# Patient Record
Sex: Male | Born: 1980 | Race: White | Hispanic: No | Marital: Married | Smoking: Current every day smoker
Health system: Southern US, Community
[De-identification: ages and names within clinical notes are randomized; demographics above are authoritative.]

## PROBLEM LIST (undated history)

## (undated) DIAGNOSIS — F32A Depression, unspecified: Secondary | ICD-10-CM

## (undated) DIAGNOSIS — T7840XA Allergy, unspecified, initial encounter: Secondary | ICD-10-CM

## (undated) DIAGNOSIS — F1910 Other psychoactive substance abuse, uncomplicated: Secondary | ICD-10-CM

## (undated) DIAGNOSIS — F329 Major depressive disorder, single episode, unspecified: Secondary | ICD-10-CM

## (undated) DIAGNOSIS — F419 Anxiety disorder, unspecified: Secondary | ICD-10-CM

## (undated) DIAGNOSIS — R569 Unspecified convulsions: Secondary | ICD-10-CM

## (undated) DIAGNOSIS — G4700 Insomnia, unspecified: Secondary | ICD-10-CM

## (undated) HISTORY — DX: Major depressive disorder, single episode, unspecified: F32.9

## (undated) HISTORY — DX: Unspecified convulsions: R56.9

## (undated) HISTORY — DX: Depression, unspecified: F32.A

## (undated) HISTORY — DX: Other psychoactive substance abuse, uncomplicated: F19.10

## (undated) HISTORY — DX: Insomnia, unspecified: G47.00

## (undated) HISTORY — DX: Anxiety disorder, unspecified: F41.9

## (undated) HISTORY — DX: Allergy, unspecified, initial encounter: T78.40XA

---

## 2003-07-03 HISTORY — PX: WISDOM TOOTH EXTRACTION: SHX21

## 2016-08-14 ENCOUNTER — Other Ambulatory Visit: Payer: Self-pay | Admitting: *Deleted

## 2016-08-14 ENCOUNTER — Ambulatory Visit
Admission: RE | Admit: 2016-08-14 | Discharge: 2016-08-14 | Disposition: A | Discharge status: Home / Self Care | Payer: PRIVATE HEALTH INSURANCE | Source: Ambulatory Visit | Attending: *Deleted | Admitting: *Deleted

## 2016-08-14 DIAGNOSIS — T148XXA Other injury of unspecified body region, initial encounter: Secondary | ICD-10-CM

## 2016-10-11 ENCOUNTER — Ambulatory Visit (INDEPENDENT_AMBULATORY_CARE_PROVIDER_SITE_OTHER): Payer: 59 | Admitting: Physician Assistant

## 2016-10-11 ENCOUNTER — Encounter: Payer: Self-pay | Admitting: Physician Assistant

## 2016-10-11 VITALS — BP 120/84 | HR 80 | Temp 98.1°F | Ht 75.0 in | Wt 197.4 lb

## 2016-10-11 DIAGNOSIS — Z720 Tobacco use: Secondary | ICD-10-CM | POA: Insufficient documentation

## 2016-10-11 DIAGNOSIS — F32A Depression, unspecified: Secondary | ICD-10-CM

## 2016-10-11 DIAGNOSIS — F419 Anxiety disorder, unspecified: Secondary | ICD-10-CM | POA: Diagnosis not present

## 2016-10-11 DIAGNOSIS — F329 Major depressive disorder, single episode, unspecified: Secondary | ICD-10-CM | POA: Diagnosis not present

## 2016-10-11 DIAGNOSIS — G4700 Insomnia, unspecified: Secondary | ICD-10-CM

## 2016-10-11 DIAGNOSIS — F1911 Other psychoactive substance abuse, in remission: Secondary | ICD-10-CM | POA: Insufficient documentation

## 2016-10-11 MED ORDER — VENLAFAXINE HCL ER 150 MG PO CP24: 150.0000 mg | ORAL_CAPSULE | Freq: Every day | ORAL | 2 refills | Status: DC

## 2016-10-11 NOTE — Progress Notes (Signed)
Reginald Bentley is a 36 y.o. male here to Establish Care and Follow up from Treatment Center.   I acted as a Neurosurgeon for [employer], PA-C Corky Mull, LPN  History of Present Illness:   Chief Complaint  Patient presents with  . Establish Care  . Follow up from Treatment [facility] for [facility] and [facility]   Acute Concerns: none  Chronic Issues: Anxiety and depression -- Effexor 75 mg BID, denies any suicidal or thoughts, feels well controlled on this medication since being in rehab Alcohol abuse -- started drinking around age 58, "blackout drinker" since age 16, was recently in a treatment center for alcohol and drugs from 08/06/16 to 09/03/16, significant family history of suicide and drug/alcohol abuse, seeing therapist weekly, wife has also stopped drinking in support, he feels like he has turned a new leaf and is doing better and has a good support system Tobacco abuse -- smokes about 1/2 PPD  Health Maintenance: Immunizations -- needs Tdap Colonoscopy -- no family hx, will schedule at age 29 Diet -- smoothies in the morning, tries to eat all food groups, occasionally has fast food when diet allows Caffeine intake -- coffee in morning, no energy drinks Sleep habits -- takes Trazodone, does have a little bit of brain fog in AM, 7-8 hours per night Exercise -- limited secondary to time Weight -- Weight: 197 lb 6.1 oz (89.5 kg) 197 lb -- normally around 195 lb Mood -- calm and able to "respond instead of react" while on Effexor  Depression screen [facility] 2/9 10/11/2016  Decreased Interest 0  Down, Depressed, Hopeless 0  PHQ - 2 Score 0  Altered sleeping 0  Tired, decreased energy 1  Change in appetite 0  Feeling bad or failure about yourself  1  Trouble concentrating 2  Moving slowly or fidgety/restless 0  Suicidal thoughts 0  PHQ-9 Score 4    Other providers/specialists: Therapist - Victorino Dike at [facility]  PMHx, SurgHx, SocialHx, Medications, and Allergies were  reviewed in the Visit Navigator and updated as appropriate.  Current Medications:   Current Outpatient Prescriptions:  .  methocarbamol (ROBAXIN) 750 MG tablet, Take 750 mg by mouth as needed. , Disp: , Rfl:  .  Travoprost, BAK Free, (TRAVATAN Z) 0.004 % SOLN ophthalmic solution, INT 1 GTT IN OU QHS, Disp: , Rfl:  .  trazodone (DESYREL) 300 MG tablet, Take 50-75 mg by mouth at bedtime. , Disp: , Rfl:  .  venlafaxine XR (EFFEXOR XR) 150 MG 24 hr capsule, Take 1 capsule (150 mg total) by mouth daily with breakfast., Disp: 30 capsule, Rfl: 2   Review of Systems:   Review of Systems  Constitutional: Positive for malaise/fatigue.  HENT: Negative.   Eyes: Negative.   Respiratory: Negative.   Cardiovascular: Negative.   Gastrointestinal: Negative.   Genitourinary: Negative.   Musculoskeletal:       Stiffness back, shoulder and neck area  Skin: Negative.   Neurological: Negative.   Endo/Heme/Allergies: Positive for environmental allergies.  Psychiatric/Behavioral: The patient is nervous/anxious and has insomnia.     Vitals:   Vitals:   10/11/16 0900  BP: 120/84  Pulse: 80  Temp: 98.1 F (36.7 C)  TempSrc: Oral  SpO2: 95%  Weight: 197 lb 6.1 oz (89.5 kg)  Height:  (1.905 m)     Body mass index is 24.67 kg/m.  Physical Exam:   Physical Exam  Constitutional: He appears well-developed. He is cooperative.  Non-toxic appearance. He does not  have a sickly appearance. He does not appear ill. No distress.  Cardiovascular: Normal rate, regular rhythm, S1 normal, S2 normal, normal heart sounds and normal pulses.   No LE edema  Pulmonary/Chest: Effort normal and breath sounds normal.  Neurological: He is alert.  Psychiatric: He has a normal mood and affect.  Nursing note and vitals reviewed.     Assessment and Plan:    Problem List Items Addressed This Visit      Other   Anxiety - Primary    Well controlled. Refill Effexor XR 150 mg daily x 3 months. Follow-up then.       Relevant Medications   trazodone (DESYREL) 300 MG tablet   venlafaxine XR (EFFEXOR XR) 150 MG 24 hr capsule   Depression    PHQ-9 is 4 today. Continue Effexor. Follow-up in 3 months, sooner if needed.      Relevant Medications   trazodone (DESYREL) 300 MG tablet   venlafaxine XR (EFFEXOR XR) 150 MG 24 hr capsule       . Reviewed expectations re: course of current medical issues. . Discussed self-management of symptoms. . Outlined signs and symptoms indicating need for more acute intervention. . Patient verbalized understanding and all questions were answered. . See orders for this visit as documented in the electronic medical record. . Patient received an After-Visit Summary.  CMA or LPN served as scribe during this visit. History, Physical, and Plan performed by medical provider. Documentation and orders reviewed and attested to.  Jarold Motto, PA-C

## 2016-10-11 NOTE — Assessment & Plan Note (Signed)
PHQ-9 is 4 today. Continue Effexor. Follow-up in 3 months, sooner if needed.

## 2016-10-11 NOTE — Patient Instructions (Signed)
It was great meeting you today!  Let's follow up in 3 months for a physical and to talk about how the effexor is working for you. We are here for you sooner if needed!  Welcome to [facility]!

## 2016-10-11 NOTE — Assessment & Plan Note (Signed)
Well controlled. Refill Effexor XR 150 mg daily x 3 months. Follow-up then.

## 2016-10-11 NOTE — Progress Notes (Signed)
Pre visit review using our clinic review tool, if applicable. No additional management support is needed unless otherwise documented below in the visit note. 

## 2016-11-01 ENCOUNTER — Encounter: Payer: Self-pay | Admitting: Physician Assistant

## 2016-11-01 ENCOUNTER — Ambulatory Visit (INDEPENDENT_AMBULATORY_CARE_PROVIDER_SITE_OTHER): Payer: 59 | Admitting: Physician Assistant

## 2016-11-01 VITALS — BP 130/84 | HR 90 | Temp 98.0°F | Ht 75.0 in | Wt 191.2 lb

## 2016-11-01 DIAGNOSIS — W57XXXA Bitten or stung by nonvenomous insect and other nonvenomous arthropods, initial encounter: Secondary | ICD-10-CM

## 2016-11-01 DIAGNOSIS — L03114 Cellulitis of left upper limb: Secondary | ICD-10-CM | POA: Diagnosis not present

## 2016-11-01 MED ORDER — DOXYCYCLINE HYCLATE 100 MG PO TABS: 100.0000 mg | ORAL_TABLET | Freq: Two times a day (BID) | ORAL | 0 refills | Status: DC

## 2016-11-01 NOTE — Progress Notes (Signed)
Reginald Bentley R Bentley is a 36 y.o. male here for a Tick bite left inner forearm.  I acted as a Neurosurgeonscribe for [employer]antha Babe Anthis, PA-C Reginald Mullonna Orphanos, LPN  History of Present Illness:   Chief Complaint  Patient presents with  . Insect Bite    Tick bite, Monday left inner forearm  . red and swollen  . Pruritis    HPI  Patient reports that on Monday he removed a tick from his left inner forearm, suspects that it came off of his dog Sunday night while they were sleeping and attached in the middle of the night. He suspects that the tick was not attached to him for more than 6-8 hours. He denies fever, chills, nausea, headaches, vomiting, rash. He does note that the site from where the tick was is now slightly tender, swollen, and very itchy. He has been trying his best not to itch it. He has not taken anything for his symptoms. He denies any history of prior tickborne illness.   PMHx, SurgHx, SocialHx, Medications, and Allergies were reviewed in the Visit Navigator and updated as appropriate.  Current Medications:   Current Outpatient Prescriptions:  .  methocarbamol (ROBAXIN) 750 MG tablet, Take 750 mg by mouth as needed. , Disp: , Rfl:  .  Travoprost, BAK Free, (TRAVATAN Z) 0.004 % SOLN ophthalmic solution, INT 1 GTT IN OU QHS, Disp: , Rfl:  .  trazodone (DESYREL) 300 MG tablet, Take 50-75 mg by mouth at bedtime. , Disp: , Rfl:  .  venlafaxine XR (EFFEXOR XR) 150 MG 24 hr capsule, Take 1 capsule (150 mg total) by mouth daily with breakfast., Disp: 30 capsule, Rfl: 2 .  doxycycline (VIBRA-TABS) 100 MG tablet, Take 1 tablet (100 mg total) by mouth 2 (two) times daily., Disp: 20 tablet, Rfl: 0   Review of Systems:   Review of Systems  Constitutional: Negative for chills and fever.  HENT: Negative.   Eyes: Negative.   Respiratory: Negative.   Cardiovascular: Negative.   Gastrointestinal: Negative.   Genitourinary: Negative.   Musculoskeletal: Negative.   Skin: Positive for itching.       Left  inner forearm red raised area, swollen.  Neurological: Negative for sensory change, loss of consciousness and headaches.  Endo/Heme/Allergies: Negative.   Psychiatric/Behavioral: Negative.     Vitals:   Vitals:   11/01/16 0941  BP: 130/84  Pulse: 90  Temp: 98 F (36.7 C)  TempSrc: Oral  SpO2: 97%  Weight: 191 lb 4 oz (86.8 kg)  Height: 6\' 3"  (1.905 m)     Body mass index is 23.9 kg/m.  Physical Exam:   Physical Exam  Constitutional: He appears well-developed. He is cooperative.  Non-toxic appearance. He does not have a sickly appearance. He does not appear ill. No distress.  Cardiovascular: Normal rate, regular rhythm, S1 normal, S2 normal, normal heart sounds and normal pulses.   No LE edema  Pulmonary/Chest: Effort normal and breath sounds normal.  Neurological: He is alert.  Skin:  Nickel-sized area of erythema and slightly raised skin at anterior left forearm, no discharge or streaking evident  Nursing note and vitals reviewed.     Assessment and Plan:    Reginald Bentley was seen today for insect bite, red and swollen and pruritis.  Diagnoses and all orders for this visit:  Cellulitis of left upper extremity  Tick bite, initial encounter  Other orders -     doxycycline (VIBRA-TABS) 100 MG tablet; Take 1 tablet (100 mg total) by mouth  2 (two) times daily.  Patient with what appears to be a secondary skin infection of prior tick bite site. I have low suspicion for tickborne illness. Reassured patient. Also provided him with list of red flags to return to clinic in case he develops concerns for tickborne illness. Start doxycycline per orders. May take antihistamine to help with itching. I also advised to use ice packs to also help with itching. Avoid scratching. Patient agreeable to plan.  . Reviewed expectations re: course of current medical issues. . Discussed self-management of symptoms. . Outlined signs and symptoms indicating need for more acute  intervention. . Patient verbalized understanding and all questions were answered. . See orders for this visit as documented in the electronic medical record. . Patient received an After-Visit Summary.  CMA or LPN served as scribe during this visit. History, Physical, and Plan performed by medical provider. Documentation and orders reviewed and attested to.  Jarold Motto, PA-C

## 2016-11-01 NOTE — Progress Notes (Signed)
Pre visit review using our clinic review tool, if applicable. No additional management support is needed unless otherwise documented below in the visit note. 

## 2016-11-01 NOTE — Patient Instructions (Signed)
Start doxycycline today.  Tick Bite Information, Adult Ticks are insects that draw blood for food. Most ticks live in shrubs and grassy areas. They climb onto people and animals that brush against the leaves and grasses that they rest on. Then they bite, attaching themselves to the skin. Most ticks are harmless, but some ticks carry germs that can spread to a person through a bite and cause a disease. To reduce your risk of getting a disease from a tick bite, it is important to take steps to prevent tick bites. It is also important to check for ticks after being outdoors. If you find that a tick has attached to you, watch for symptoms of disease. How can I prevent tick bites? Take these steps to help prevent tick bites when you are outdoors in an area where ticks are found:  Use insect repellent that has DEET (20% or higher), picaridin, or IR3535 in it. Use it on:  Skin that is showing.  The top of your boots.  Your pant legs.  Your sleeve cuffs.  For repellent products that contain permethrin, follow product instructions. Use these products on:  Clothing.  Gear.  Boots.  Tents.  Wear protective clothing. Long sleeves and long pants offer the best protection from ticks.  Wear light-colored clothing so you can see ticks more easily.  Tuck your pant legs into your socks.  If you go walking on a trail, stay in the middle of the trail so your skin, hair, and clothing do not touch the bushes.  Avoid walking through areas with long grass.  Check for ticks on your clothing, hair, and skin often while you are outside, and check again before you go inside. Make sure to check the places that ticks attach themselves most often. These places include the scalp, neck, armpits, waist, groin, and joint areas. Ticks that carry a disease called Lyme disease have to be attached to the skin for 24-48 hours. Checking for ticks every day will lessen your risk of this and other diseases.  When you  come indoors, wash your clothes and take a shower or a bath right away. Dry your clothes in a dryer on high heat for at least 60 minutes. This will kill any ticks in your clothes. What is the proper way to remove a tick? If you find a tick on your body, remove it as soon as possible. Removing a tick sooner rather than later can prevent germs from passing from the tick to your body. To remove a tick that is crawling on your skin but has not bitten:  Go outdoors and brush the tick off.  Remove the tick with tape or a lint roller. To remove a tick that is attached to your skin:  Wash your hands.  If you have latex gloves, put them on.  Use tweezers, curved forceps, or a tick-removal tool to gently grasp the tick as close to your skin and the tick's head as possible.  Gently pull with steady, upward pressure until the tick lets go. When removing the tick:  Take care to keep the tick's head attached to its body.  Do not twist or jerk the tick. This can make the tick's head or mouth break off.  Do not squeeze or crush the tick's body. This could force disease-carrying fluids from the tick into your body. Do not try to remove a tick with heat, alcohol, petroleum jelly, or fingernail polish. Using these methods can cause the tick to salivate  and regurgitate into your bloodstream, increasing your risk of getting a disease. What should I do after removing a tick?  Clean the bite area with soap and water, rubbing alcohol, or an iodine scrub.  If an antiseptic cream or ointment is available, apply a small amount to the bite site.  Wash and disinfect any instruments that you used to remove the tick. How should I dispose of a tick? To dispose of a live tick, use one of these methods:  Place it in rubbing alcohol.  Place it in a sealed bag or container.  Wrap it tightly in tape.  Flush it down the toilet. Contact a health care provider if:  You have symptoms of a disease after a tick bite.  Symptoms of a tick-borne disease can occur from moments after the tick bites to up to 30 days after a tick is removed. Symptoms include:  Muscle, joint, or bone pain.  Difficulty walking or moving your legs.  Numbness in the legs.  Paralysis.  Red rash around the tick bite area that is shaped like a target or a "bull's-eye."  Redness and swelling in the area of the tick bite.  Fever.  Repeated vomiting.  Diarrhea.  Weight loss.  Tender, swollen lymph glands.  Shortness of breath.  Cough.  Pain in the abdomen.  Headache.  Abnormal tiredness.  A change in your level of consciousness.  Confusion. Get help right away if:  You are not able to remove a tick.  A part of a tick breaks off and gets stuck in your skin.  Your symptoms get worse. Summary  Ticks may carry germs that can spread to a person through a bite and cause disease.  Wear protective clothing and use insect repellent to prevent tick bites. Follow product instructions.  If you find a tick on your body, remove it as soon as possible. If the tick is attached, do not try to remove with heat, alcohol, petroleum jelly, or fingernail polish.  Remove the attached tick using tweezers, curved forceps, or a tick-removal tool. Gently pull with steady, upward pressure until the tick lets go. Do not twist or jerk the tick. Do not squeeze or crush the tick's body.  If you have symptoms after being bitten by a tick, contact a health care provider. This information is not intended to replace advice given to you by your health care provider. Make sure you discuss any questions you have with your health care provider. Document Released: 06/15/2000 Document Revised: 03/30/2016 Document Reviewed: 03/30/2016 Elsevier Interactive Patient Education  2017 [employer]

## 2016-11-05 ENCOUNTER — Telehealth: Payer: Self-pay | Admitting: Physician Assistant

## 2016-11-05 NOTE — Telephone Encounter (Signed)
Patient called in to get RX filled   Travoprost, BAK Free, (TRAVATAN Z) 0.004 % SOLN ophthalmic solution    venlafaxine XR (EFFEXOR XR) 150 MG 24 hr capsule   [facility] [address]  [facility] [phone] - [place], [place] - [address] AT [facility] [phone] (Phone) [phone] (Fax)   Patient stated he needed it filled sometime this week due to going out of town. Please call patient and advise when RX has been sent.

## 2016-11-13 ENCOUNTER — Emergency Department (HOSPITAL_COMMUNITY)
Admission: EM | Admit: 2016-11-13 | Discharge: 2016-11-14 | Disposition: A | Discharge status: Other Acute Inpatient Hospital | Payer: 59 | Attending: Emergency Medicine | Admitting: Emergency Medicine

## 2016-11-13 DIAGNOSIS — Z79899 Other long term (current) drug therapy: Secondary | ICD-10-CM | POA: Insufficient documentation

## 2016-11-13 DIAGNOSIS — F1410 Cocaine abuse, uncomplicated: Secondary | ICD-10-CM | POA: Diagnosis not present

## 2016-11-13 DIAGNOSIS — F17210 Nicotine dependence, cigarettes, uncomplicated: Secondary | ICD-10-CM | POA: Insufficient documentation

## 2016-11-13 DIAGNOSIS — Z046 Encounter for general psychiatric examination, requested by authority: Secondary | ICD-10-CM | POA: Diagnosis present

## 2016-11-13 DIAGNOSIS — F10120 Alcohol abuse with intoxication, uncomplicated: Secondary | ICD-10-CM | POA: Diagnosis not present

## 2016-11-13 DIAGNOSIS — F1020 Alcohol dependence, uncomplicated: Secondary | ICD-10-CM | POA: Diagnosis present

## 2016-11-13 DIAGNOSIS — F10920 Alcohol use, unspecified with intoxication, uncomplicated: Secondary | ICD-10-CM

## 2016-11-13 DIAGNOSIS — Z813 Family history of other psychoactive substance abuse and dependence: Secondary | ICD-10-CM | POA: Diagnosis not present

## 2016-11-13 DIAGNOSIS — F339 Major depressive disorder, recurrent, unspecified: Secondary | ICD-10-CM | POA: Diagnosis present

## 2016-11-13 DIAGNOSIS — Z811 Family history of alcohol abuse and dependence: Secondary | ICD-10-CM | POA: Diagnosis not present

## 2016-11-13 DIAGNOSIS — F330 Major depressive disorder, recurrent, mild: Secondary | ICD-10-CM | POA: Diagnosis not present

## 2016-11-14 ENCOUNTER — Inpatient Hospital Stay (HOSPITAL_COMMUNITY)
Admission: AD | Admit: 2016-11-14 | Discharge: 2016-11-17 | DRG: 897 | Disposition: A | Discharge status: Home / Self Care | Payer: 59 | Attending: Psychiatry | Admitting: Psychiatry

## 2016-11-14 ENCOUNTER — Encounter (HOSPITAL_COMMUNITY): Payer: Self-pay | Admitting: *Deleted

## 2016-11-14 ENCOUNTER — Encounter (HOSPITAL_COMMUNITY): Payer: Self-pay

## 2016-11-14 DIAGNOSIS — Z818 Family history of other mental and behavioral disorders: Secondary | ICD-10-CM

## 2016-11-14 DIAGNOSIS — H409 Unspecified glaucoma: Secondary | ICD-10-CM | POA: Diagnosis present

## 2016-11-14 DIAGNOSIS — F10120 Alcohol abuse with intoxication, uncomplicated: Secondary | ICD-10-CM | POA: Diagnosis not present

## 2016-11-14 DIAGNOSIS — F1490 Cocaine use, unspecified, uncomplicated: Secondary | ICD-10-CM | POA: Diagnosis present

## 2016-11-14 DIAGNOSIS — F1020 Alcohol dependence, uncomplicated: Secondary | ICD-10-CM

## 2016-11-14 DIAGNOSIS — Z8669 Personal history of other diseases of the nervous system and sense organs: Secondary | ICD-10-CM

## 2016-11-14 DIAGNOSIS — Y906 Blood alcohol level of 120-199 mg/100 ml: Secondary | ICD-10-CM | POA: Diagnosis present

## 2016-11-14 DIAGNOSIS — F4310 Post-traumatic stress disorder, unspecified: Secondary | ICD-10-CM | POA: Diagnosis not present

## 2016-11-14 DIAGNOSIS — F17210 Nicotine dependence, cigarettes, uncomplicated: Secondary | ICD-10-CM | POA: Diagnosis present

## 2016-11-14 DIAGNOSIS — F1290 Cannabis use, unspecified, uncomplicated: Secondary | ICD-10-CM | POA: Diagnosis present

## 2016-11-14 DIAGNOSIS — Z811 Family history of alcohol abuse and dependence: Secondary | ICD-10-CM | POA: Diagnosis not present

## 2016-11-14 DIAGNOSIS — F339 Major depressive disorder, recurrent, unspecified: Secondary | ICD-10-CM | POA: Diagnosis present

## 2016-11-14 DIAGNOSIS — F1994 Other psychoactive substance use, unspecified with psychoactive substance-induced mood disorder: Secondary | ICD-10-CM | POA: Diagnosis present

## 2016-11-14 DIAGNOSIS — G4700 Insomnia, unspecified: Secondary | ICD-10-CM | POA: Diagnosis present

## 2016-11-14 DIAGNOSIS — R45851 Suicidal ideations: Secondary | ICD-10-CM | POA: Diagnosis present

## 2016-11-14 DIAGNOSIS — F330 Major depressive disorder, recurrent, mild: Secondary | ICD-10-CM | POA: Diagnosis not present

## 2016-11-14 DIAGNOSIS — Z79899 Other long term (current) drug therapy: Secondary | ICD-10-CM | POA: Diagnosis not present

## 2016-11-14 DIAGNOSIS — Z813 Family history of other psychoactive substance abuse and dependence: Secondary | ICD-10-CM | POA: Diagnosis not present

## 2016-11-14 DIAGNOSIS — Z91018 Allergy to other foods: Secondary | ICD-10-CM | POA: Diagnosis not present

## 2016-11-14 DIAGNOSIS — F419 Anxiety disorder, unspecified: Secondary | ICD-10-CM | POA: Diagnosis present

## 2016-11-14 DIAGNOSIS — F10229 Alcohol dependence with intoxication, unspecified: Secondary | ICD-10-CM | POA: Diagnosis present

## 2016-11-14 LAB — COMPREHENSIVE METABOLIC PANEL
ALT: 25 U/L (ref 17–63)
AST: 32 U/L (ref 15–41)
Albumin: 4.1 g/dL (ref 3.5–5.0)
Alkaline Phosphatase: 54 U/L (ref 38–126)
Anion gap: 10 (ref 5–15)
BUN: 12 mg/dL (ref 6–20)
CHLORIDE: 106 mmol/L (ref 101–111)
CO2: 27 mmol/L (ref 22–32)
CREATININE: 0.81 mg/dL (ref 0.61–1.24)
Calcium: 8.5 mg/dL — ABNORMAL LOW (ref 8.9–10.3)
GFR calc Af Amer: >60 mL/min (ref >60)
GFR calc non Af Amer: >60 mL/min (ref >60)
GLUCOSE: 101 mg/dL — AB (ref 65–99)
POTASSIUM: 3.7 mmol/L (ref 3.5–5.1)
Sodium: 143 mmol/L (ref 135–145)
Total Bilirubin: 0.3 mg/dL (ref 0.3–1.2)
Total Protein: 6.6 g/dL (ref 6.5–8.1)

## 2016-11-14 LAB — RAPID URINE DRUG SCREEN, HOSP PERFORMED
AMPHETAMINES: NOT DETECTED
BARBITURATES: NOT DETECTED
BENZODIAZEPINES: NOT DETECTED
Cocaine: NOT DETECTED
Opiates: NOT DETECTED
TETRAHYDROCANNABINOL: NOT DETECTED

## 2016-11-14 LAB — CBC
HEMATOCRIT: 40 % (ref 39.0–52.0)
Hemoglobin: 13.4 g/dL (ref 13.0–17.0)
MCH: 28.6 pg (ref 26.0–34.0)
MCHC: 33.5 g/dL (ref 30.0–36.0)
MCV: 85.3 fL (ref 78.0–100.0)
PLATELETS: 300 10*3/uL (ref 150–400)
RBC: 4.69 MIL/uL (ref 4.22–5.81)
RDW: 13.1 % (ref 11.5–15.5)
WBC: 7 10*3/uL (ref 4.0–10.5)

## 2016-11-14 LAB — ACETAMINOPHEN LEVEL: Acetaminophen (Tylenol), Serum: <10 ug/mL — ABNORMAL LOW (ref 10–30)

## 2016-11-14 LAB — SALICYLATE LEVEL: Salicylate Lvl: <7 mg/dL (ref 2.8–30.0)

## 2016-11-14 LAB — ETHANOL: Alcohol, Ethyl (B): 175 mg/dL — ABNORMAL HIGH (ref <5)

## 2016-11-14 MED ORDER — LORAZEPAM 1 MG PO TABS: 1.0000 mg | ORAL_TABLET | Freq: Four times a day (QID) | ORAL | Status: DC | PRN

## 2016-11-14 MED ORDER — LOPERAMIDE HCL 2 MG PO CAPS: 2.0000 mg | ORAL_CAPSULE | ORAL | Status: DC | PRN

## 2016-11-14 MED ORDER — LORAZEPAM 1 MG PO TABS
0.0000 mg | ORAL_TABLET | Freq: Four times a day (QID) | ORAL | Status: DC
  Administered 2016-11-14: 2 mg via ORAL
  Administered 2016-11-14: 1 mg via ORAL
  Filled 2016-11-14: qty 1
  Filled 2016-11-14: qty 2

## 2016-11-14 MED ORDER — ACETAMINOPHEN 325 MG PO TABS: 650.0000 mg | ORAL_TABLET | ORAL | Status: DC | PRN

## 2016-11-14 MED ORDER — TRAZODONE HCL 50 MG PO TABS
50.0000 mg | ORAL_TABLET | Freq: Every evening | ORAL | Status: DC | PRN
  Administered 2016-11-14 – 2016-11-16 (×3): 50 mg via ORAL
  Filled 2016-11-14 (×3): qty 1

## 2016-11-14 MED ORDER — THIAMINE HCL 100 MG/ML IJ SOLN: 100.0000 mg | Freq: Once | INTRAMUSCULAR | Status: DC

## 2016-11-14 MED ORDER — ONDANSETRON HCL 4 MG PO TABS: 4.0000 mg | ORAL_TABLET | Freq: Three times a day (TID) | ORAL | Status: DC | PRN

## 2016-11-14 MED ORDER — VENLAFAXINE HCL ER 75 MG PO CP24
150.0000 mg | ORAL_CAPSULE | Freq: Every day | ORAL | Status: DC
  Administered 2016-11-14: 150 mg via ORAL
  Filled 2016-11-14: qty 2

## 2016-11-14 MED ORDER — NICOTINE 21 MG/24HR TD PT24
21.0000 mg | MEDICATED_PATCH | Freq: Every day | TRANSDERMAL | Status: DC
  Administered 2016-11-16: 21 mg via TRANSDERMAL
  Filled 2016-11-14 (×5): qty 1

## 2016-11-14 MED ORDER — HYDROXYZINE HCL 25 MG PO TABS
25.0000 mg | ORAL_TABLET | Freq: Four times a day (QID) | ORAL | Status: DC | PRN
  Administered 2016-11-14: 25 mg via ORAL
  Filled 2016-11-14: qty 1

## 2016-11-14 MED ORDER — ACETAMINOPHEN 325 MG PO TABS: 650.0000 mg | ORAL_TABLET | Freq: Four times a day (QID) | ORAL | Status: DC | PRN

## 2016-11-14 MED ORDER — VENLAFAXINE HCL ER 150 MG PO CP24
150.0000 mg | ORAL_CAPSULE | Freq: Every day | ORAL | Status: DC
  Administered 2016-11-15 – 2016-11-17 (×3): 150 mg via ORAL
  Filled 2016-11-14 (×5): qty 1

## 2016-11-14 MED ORDER — VITAMIN B-1 100 MG PO TABS
100.0000 mg | ORAL_TABLET | Freq: Every day | ORAL | Status: DC
  Administered 2016-11-15 – 2016-11-17 (×3): 100 mg via ORAL
  Filled 2016-11-14 (×5): qty 1

## 2016-11-14 MED ORDER — TRAZODONE HCL 50 MG PO TABS: 50.0000 mg | ORAL_TABLET | Freq: Every day | ORAL | Status: DC

## 2016-11-14 MED ORDER — ALUM & MAG HYDROXIDE-SIMETH 200-200-20 MG/5ML PO SUSP: 30.0000 mL | ORAL | Status: DC | PRN

## 2016-11-14 MED ORDER — LORAZEPAM 1 MG PO TABS: 0.0000 mg | ORAL_TABLET | Freq: Two times a day (BID) | ORAL | Status: DC

## 2016-11-14 MED ORDER — ADULT MULTIVITAMIN W/MINERALS CH
1.0000 | ORAL_TABLET | Freq: Every day | ORAL | Status: DC
  Administered 2016-11-14 – 2016-11-17 (×4): 1 via ORAL
  Filled 2016-11-14 (×7): qty 1

## 2016-11-14 MED ORDER — NICOTINE 21 MG/24HR TD PT24: 21.0000 mg | MEDICATED_PATCH | Freq: Every day | TRANSDERMAL | Status: DC

## 2016-11-14 MED ORDER — MAGNESIUM HYDROXIDE 400 MG/5ML PO SUSP: 30.0000 mL | Freq: Every day | ORAL | Status: DC | PRN
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
## 2016-11-14 NOTE — Progress Notes (Signed)
CSW spoke with patient per RN's request. Patient voiced concerns with length of stay at hospital. CSW informed patient CSW would update MD in the AM.   Stacy GardnerErin Ephrata [place]erville, [facility] Medical CenterCSWA Clinical Social Worker [phone]

## 2016-11-14 NOTE — Progress Notes (Signed)
Adult Psychoeducational Group Note  Date:  11/14/2016 Time:  11:02 PM  Group Topic/Focus:  Wrap-Up Group:   The focus of this group is to help patients review their daily goal of treatment and discuss progress on daily workbooks.  Participation Level:  Did Not Attend  Additional Comments: Pt did not attend group. Pt remained in bed. Pt encouraged to attend group.  Karleen HampshireFox, Zania Kalisz Brittini 11/14/2016, 11:02 PM

## 2016-11-14 NOTE — ED Notes (Signed)
Patient denies SI,HI and AVH at this time. Plan of care discussed with patient. Encouragement and support provided and safety maintain. Q 15 min safety checks in place and video monitoring. 

## 2016-11-14 NOTE — Progress Notes (Signed)
11/14/16 1415:  LRT went to pt room to offer activities, pt was sleep.  Lecia Esperanza, LRT/CTRS 

## 2016-11-14 NOTE — ED Triage Notes (Signed)
Pt stated "I bought pizza for my sons, went to see my sponsor, came home & [facility]  My wife is trying to get rid of me I think.  [facility].  I checked in on 08/02/16.  I haven't done cocaine since last year.  I didn't do it all the time."  Pt denies SI/HI.

## 2016-11-14 NOTE — ED Notes (Signed)
Pt compliant with medication regimen. Sad affect. Pt denies SI/HI/AVH. Encouragement and support provided. Special checks q 15 mins in place for safety, Video monitoring in place. Will continue to monitor.

## 2016-11-14 NOTE — Progress Notes (Signed)
Reginald RuizJohn is a 36 year old male being admitted involunt[facility].  He came in under IVC for suicidal statements and alcohol abuse.  He reported that he was sober for 100 days and relapsed over the weekend.  His wife reported that he had been drinking for 4 days straight and made statements about wanting to no longer live and made statements that he wanted to drink himself to death.  He reported that his mother killed herself 2 years ago and his father died a year ago with liver failure.  He denies any medical issues and appears to be in no physical distress.  He denies SI/HI or A/V hallucinations.  Oriented him to the unit.  Admission paperwork completed and signed.  Belongings searched and none needing to be locked in locker.  Skin assessment completed and noted small abrasion to right cheek, R upper arm, R forearm and right upper chest.  Rash on left forearm noted and he stated that he believed was from sand fleas at the beach this past weekend.  Q 15 minute checks initiated for safety.  We will monitor the progress towards his goals.
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
## 2016-11-14 NOTE — ED Notes (Signed)
[facility].  Pt signed for personal property and property given to sheriff for transfer. Pt ambulatory off unit in police custody.

## 2016-11-14 NOTE — Tx Team (Signed)
Initial Treatment Plan 11/14/2016 7:53 PM Reginald GamblesJohn R Grattan NWG:[id number]    PATIENT STRESSORS: Financial difficulties Loss of mother by suicide and father of liver failure Marital or family conflict Substance abuse   PATIENT STRENGTHS: Communication skills General fund of knowledge Motivation for treatment/growth Physical Health Work skills   PATIENT IDENTIFIED PROBLEMS: Depression  Anxiety  Substance abuse  Suicidal ideation/statements  "I just want to leave"  "Want to feel good about starting the program again"           DISCHARGE CRITERIA:  Improved stabilization in mood, thinking, and/or behavior Motivation to continue treatment in a less acute level of care Verbal commitment to aftercare and medication compliance Withdrawal symptoms are absent or subacute and managed without 24-hour nursing intervention  PRELIMINARY DISCHARGE PLAN: Outpatient therapy Medication management  PATIENT/FAMILY INVOLVEMENT: This treatment plan has been presented to and reviewed with the patient, Reginald GamblesJohn R Nielson.  The patient and family have been given the opportunity to ask questions and make suggestions.  Levin BaconHeather V Annalaura Sauseda, RN 11/14/2016, 7:53 PM

## 2016-11-14 NOTE — Progress Notes (Signed)
Patient denied SI and HI, contracts for safety.  Denied A/V hallucinations. Denied pain.  Stated he hoped he did not have to stay here long.  Patient went to dining room for dinner.  Patient declined the thiamine IM tonight.  Stated he would take thiamine p.o. Tomorrow morning.  Stated he did not need a nicotine patch at this time.   PRN medications reviewed with patient but stated he did not need any medications at this time. Respirations even and unlabored.  No signs/symptoms of pain/distress noted on patient's face/body movements.

## 2016-11-14 NOTE — BH Assessment (Signed)
[facility] Assessment Progress Note  Per Thedore MinsMojeed Akintayo, MD, this pt requires psychiatric hospitalization.  Malva LimesLinsey Strader, RN, [id number]; she will call when they are ready to receive pt.  Pt presents under IVC initiated by his wife, and upheld by Dr Jannifer FranklinAkintayo, and [facility].  Pt's nurse, Morrie Sheldonshley, has been notified, and agrees to call report to [phone].  Pt is to be transported via [employer].   Doylene Canninghomas Kissa Campoy, MA Triage Specialist [phone]

## 2016-11-14 NOTE — ED Notes (Addendum)
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  I worked today.  I've been going to meetings every day since at least the last 60 days.  [facility] [employer] [facility] 28 days.  For the last 3-4 days, I've drank 5-10 ounces of whiskey a day.  We went to the beach last weekend & me and my wife were drinking some drinks.  I think my sister had something to do with this.  I think she got in my wife's ear.  Our mother was a Charity fundraiserN and she committed suicide.  So, my sister likes to dx and tx people.  She's in rehab too."

## 2016-11-14 NOTE — ED Notes (Signed)
Patient educated about search process and term "contraband " and routine search performed. No contraband found. 

## 2016-11-14 NOTE — ED Provider Notes (Signed)
[facility]-EMERGENCY DEPT Provider Note   CSN: [id number] Arrival date & time: 11/13/16  2340     History   Chief Complaint Chief Complaint  Patient presents with  . IVC    HPI Reginald Bentley is a 36 y.o. male.  36 year old male with a history of anxiety, depression, substance abuse, and insomnia presents to the emergency department for psychiatric evaluation. Patient presents under involuntary commitment taken out by wife. IVC papers state that patient made remarks of being "destined to die from drinking myself to death". Patient denies any suicidal or homicidal ideations. He does report alcohol use today. He has been drinking for the last 3-4 days and reports drinking 5-10 ounces of whiskey per day. He does have a history of recreational cocaine use, but has not used cocaine since December. He denies any history of suicide attempt or suicidal ideations.   The history is provided by the patient. No language interpreter was used.    Past Medical History:  Diagnosis Date  . Allergy   . Anxiety   . Depression   . Insomnia   . Seizures (HCC)    17 years ago, was on a dilantin for 1 year  . Substance abuse     Patient Active Problem List   Diagnosis Date Noted  . Anxiety 10/11/2016  . Depression 10/11/2016  . Tobacco abuse 10/11/2016  . Drug abuse in remission 10/11/2016  . Insomnia 10/11/2016    Past Surgical History:  Procedure Laterality Date  . WISDOM TOOTH EXTRACTION  2005       Home Medications    Prior to Admission medications   Medication Sig Start Date End Date Taking? Authorizing Provider  Travoprost, BAK Free, (TRAVATAN Z) 0.004 % SOLN ophthalmic solution INT 1 GTT IN OU QHS 07/09/16  Yes [provider]  trazodone (DESYREL) 300 MG tablet Take 50-75 mg by mouth at bedtime.  09/06/16  Yes [provider]  venlafaxine XR (EFFEXOR XR) 150 MG 24 hr capsule Take 1 capsule (150 mg total) by mouth daily with breakfast. 10/11/16  Yes Jarold MottoWorley, Samantha,  PA    Family History Family History  Problem Relation Age of Onset  . Stroke Mother   . Drug abuse Mother   . Cirrhosis Father   . Alcohol abuse Sister   . Suicidality Maternal Grandfather     Social History Social History  Substance Use Topics  . Smoking status: Current Every Day Smoker    Packs/day: 0.50    Types: Cigarettes  . Smokeless tobacco: Never Used  . Alcohol use Yes     Comment: Last drink 08/03/2016     Allergies   Banana   Review of Systems Review of Systems Ten systems reviewed and are negative for acute change, except as noted in the HPI.    Physical Exam Updated Vital Signs BP 120/67   Pulse 83   Temp 98.1 F (36.7 C) (Oral)   Resp 18   Ht 6\' 3"  (1.905 m)   Wt 86.6 kg   SpO2 99%   BMI 23.87 kg/m   Physical Exam  Constitutional: He is oriented to person, place, and time. He appears well-developed and well-nourished. No distress.  HENT:  Head: Normocephalic and atraumatic.  Eyes: Conjunctivae and EOM are normal. No scleral icterus.  Neck: Normal range of motion.  Pulmonary/Chest: Effort normal. No respiratory distress.  Musculoskeletal: Normal range of motion.  Neurological: He is alert and oriented to person, place, and time. He exhibits normal muscle  tone. Coordination normal.  Skin: Skin is warm and dry. No rash noted. He is not diaphoretic. No erythema. No pallor.  Psychiatric: He has a normal mood and affect. His speech is normal and behavior is normal. He expresses no homicidal and no suicidal ideation.  Nursing note and vitals reviewed.    ED Treatments / Results  Labs (all labs ordered are listed, but only abnormal results are displayed) Labs Reviewed  COMPREHENSIVE METABOLIC PANEL - Abnormal; Notable for the following:       Result Value   Glucose, Bld 101 (*)    Calcium 8.5 (*)    All other components within normal limits  ETHANOL - Abnormal; Notable for the following:    Alcohol, Ethyl (B) 175 (*)    All other components  within normal limits  ACETAMINOPHEN LEVEL - Abnormal; Notable for the following:    Acetaminophen (Tylenol), Serum <10 (*)    All other components within normal limits  SALICYLATE LEVEL  CBC  RAPID URINE DRUG SCREEN, HOSP PERFORMED    EKG  EKG Interpretation None       Radiology No results found.  Procedures Procedures (including critical care time)  Medications Ordered in ED Medications  LORazepam (ATIVAN) tablet 0-4 mg (2 mg Oral Given 11/14/16 0612)    Followed by  LORazepam (ATIVAN) tablet 0-4 mg (not administered)  acetaminophen (TYLENOL) tablet 650 mg (not administered)  nicotine (NICODERM CQ - dosed in mg/24 hours) patch 21 mg (not administered)  ondansetron (ZOFRAN) tablet 4 mg (not administered)     Initial Impression / Assessment and Plan / ED Course  I have reviewed the triage vital signs and the nursing notes.  Pertinent labs & imaging results that were available during my care of the patient were reviewed by me and considered in my medical decision making (see chart for details).     Patient presents under IVC taken out by wife. He has been medically cleared and evaluated by TTS who recommend inpatient management. TTS to seek placement. Disposition to be determined by oncoming ED provider.   Final Clinical Impressions(s) / ED Diagnoses   Final diagnoses:  Involuntary commitment  Alcoholic intoxication without complication [facility])    New Prescriptions New Prescriptions   No medications on file     Antony Madura, Cordelia Poche 11/14/16 8119    Devoria Albe, MD 11/14/16 [phone]

## 2016-11-15 DIAGNOSIS — F4310 Post-traumatic stress disorder, unspecified: Secondary | ICD-10-CM

## 2016-11-15 NOTE — Progress Notes (Signed)
D: Pt presents with flat affect and depressed mood. Pt denies depression, anxiety and SI. Pt denies having withdrawal symptoms. Pt reports recent rehab tx. Per pt, "I know I messed up and was drinking for four days and passed out but I don't need to be here. My wife told me that if I started back drinking she was going to call the police and that's why I'm here". Pt requesting to speak to MD in regards to discharging. Pt noted to be calm and cooperative on the unit. Pt have been observed attending groups and participating. Pt compliant with meds. No side effects verbalized by pt. A: Medications reviewed with pt. Medications administered as ordered per MD. Verbal support provided. Writer explained unit expectations to pt. Pt encouraged to attend groups. 15 minute checks performed for safety.  R: Pt compliant with tx.

## 2016-11-15 NOTE — [facility] Suicide Risk Assessment (Signed)
[facility] INPATIENT:  Family/Significant Other Suicide Prevention Education  Suicide Prevention Education:  Patient Refusal for Family/Significant Other Suicide Prevention Education: The patient Reginald Bentley has refused to provide written consent for family/significant other to be provided Family/Significant Other Suicide Prevention Education during admission and/or prior to discharge.  Physician notified.  Verdene LennertLauren C Marcene Laskowski 11/15/2016, 4:46 PM

## 2016-11-15 NOTE — Progress Notes (Signed)
Adult Psychoeducational Group Note  Date:  11/15/2016 Time:  0845 am  Group Topic/Focus:  Orientation:   The focus of this group is to educate the patient on the purpose and policies of crisis stabilization and provide a format to answer questions about their admission.  The group details unit policies and expectations of patients while admitted.  Participation Level:  Active  Participation Quality:  Appropriate  Affect:  Appropriate  Cognitive:  Appropriate  Insight: Appropriate  Engagement in Group:  Engaged  Modes of Intervention:  Discussion, Education and Orientation  Additional Comments:    Elberta Lachapelle L 11/15/2016, 4:50 PM

## 2016-11-15 NOTE — Plan of Care (Signed)
Problem: Medication: Goal: Compliance with prescribed medication regimen will improve Outcome: Progressing Pt compliant with taking meds today. No side effects to meds verbalized by pt.

## 2016-11-15 NOTE — [facility] Group Notes (Signed)
[facility] LCSW Group Therapy 11/15/2016 1:15pm  Type of Therapy: Group Therapy- Balance in Life  Participation Level: Minimal  Description of the Group:  The topic for group was balance in life. Today's group focused on defining balance in one's own words, identifying things that can knock one off balance, and exploring healthy ways to maintain balance in life. Group members were asked to provide an example of a time when they felt off balance, describe how they handled that situation,and process healthier ways to regain balance in the future. Group members were asked to share the most important tool for maintaining balance that they [facility] and how they plan to apply this method after discharge.  Summary of Patient Progress Pt participated minimally in group discussion but was observed to be attentive.    Therapeutic Modalities:   Cognitive Behavioral Therapy Solution-Focused Therapy Assertiveness Training   Vernie ShanksLauren Dhanya Bogle, LCSW 11/15/2016 6:33 PM

## 2016-11-15 NOTE — [facility] Counselor (Signed)
Adult Comprehensive Assessment  Patient ID: Reginald Bentley, male   DOB: 06/03/1981, 36 y.o.   MRN: [id number]  Information Source: Information source: Patient  Current Stressors:  Educational / Learning stressors: None reported Employment / Job issues: Pt works long hours Family Relationships: tense with wife as he feels like she had malicious intentions in having him hospitalized Surveyor, quantityinancial / Lack of resources (include bankruptcy): None reported Housing / Lack of housing: Is unsure if he can return home Physical health (include injuries & life threatening diseases): Pt denies Social relationships: None reported Substance abuse: Pt reports hx of ETOH abuse; had 100 days sober until this past weekend when he relapsed at the beach Bereavement / Loss: mother, grandmother, father, best friend, and mother-in-law have all passed away in the last 2.5 yrs  Living/Environment/Situation:  Living Arrangements: Children, Spouse/significant other Living conditions (as described by patient or guardian): safe and stable How long has patient lived in current situation?: since marriage What is atmosphere in current home: Chaotic  Family History:  Marital status: Married Number of Years Married: 9 What types of issues is patient dealing with in the relationship?: no big problems, just feel that they have grown apart Does patient have children?: Yes How many children?: 2 How is patient's relationship with their children?: pretty good relationship  Childhood History:  By whom was/is the patient raised?: Mother, Grandparents Description of patient's relationship with caregiver when they were a child: mother was abusing substances; closer with grandmother Patient's description of current relationship with people who raised him/her: mother killed herself 26459yrs ago; mother and father is deceased  Does patient have siblings?: Yes Number of Siblings: 1 Description of patient's current relationship with  siblings: "alright" relationship; has also struggled with substance use Did patient suffer any verbal/emotional/physical/sexual abuse as a child?: No Did patient suffer from severe childhood neglect?: No Has patient ever been sexually abused/assaulted/raped as an adolescent or adult?: No Was the patient ever a victim of a crime or a disaster?: Yes Patient description of being a victim of a crime or disaster: PTSD from many things that have happened Witnessed domestic violence?: No Has patient been effected by domestic violence as an adult?: No  Education:  Highest grade of school patient has completed: 11th grade; GED Currently a student?: No Learning disability?: No  Employment/Work Situation:   Employment situation: Employed Where is patient currently employed?: [employer] How long has patient been employed?: 1559yrs Patient's job has been impacted by current illness: No What is the longest time patient has a held a job?: 3559yrs Where was the patient employed at that time?: current employer Has patient ever been in the Eli Lilly and Companymilitary?: No Has patient ever served in combat?: No Did You Receive Any Psychiatric Treatment/Services While in Equities trader[employer] Military?: No Are There Guns or Other Weapons in Your Home?: Yes Types of Guns/Weapons: rifles Are These ComptrollerWeapons Safely Secured?: No Who Could Verify You Are Able To Have These Secured:: possibly wife  Surveyor, quantityinancial Resources:   Financial resources: Income from employment, Income from spouse, Private insurance Does patient have a representative payee or guardian?: No  Alcohol/Substance Abuse:   What has been your use of drugs/alcohol within the last 12 months?: hx of ETOH use; recent relapse after 100 days of sobriety If attempted suicide, did drugs/alcohol play a role in this?: No Alcohol/Substance Abuse Treatment Hx: Attends AA/NA, Past Tx, Outpatient Has alcohol/substance abuse ever caused legal problems?: No  Social Support System:   Academic librarianatient's  Community Support System: Good  Describe Community Support System: sponsor, wife, other AA attendees, friends, family Type of faith/religion: None How does patient's faith help to cope with current illness?: n/a  Leisure/Recreation:   Leisure and Hobbies: beach, working out, Equities trader:   What things does the patient do well?: good at his job, good listener, being a good friend, compassionate In what areas does patient struggle / problems for patient: stress, feels tired  Discharge Plan:   Does patient have access to transportation?: Yes Will patient be returning to same living situation after discharge?: Yes Currently receiving community mental health services: Yes (From Whom) ([facility]); PCP at [facility] [employer]) If no, would patient like referral for services when discharged?: No Does patient have financial barriers related to discharge medications?: No  Summary/Recommendations:     Patient is a 36 year old male with a diagnosis Alcohol Use Disorder, Major Depressive Disorder, and PTSD per Pt report. Pt presented to the hospital under IVC taken out by his wife for continued alcohol abuse and suicidal statements and gestures. Pt reports primary trigger(s) for admission include recent relapse on alcohol and stress from work. Patient will benefit from crisis stabilization, medication evaluation, group therapy and psycho education in addition to case management for discharge planning. At discharge it is recommended that Pt remain compliant with established discharge plan and continued treatment.   Verdene Lennert. 11/15/2016

## 2016-11-15 NOTE — Tx Team (Signed)
Interdisciplinary Treatment and Diagnostic Plan Update  11/15/2016 Time of Session: 9:30am HASKEL DEWALT MRN: [id number]  Principal Diagnosis: <principal problem not specified>  Secondary Diagnoses: Active Problems:   Substance induced mood disorder (HCC)   Current Medications:  Current Facility-Administered Medications  Medication Dose Route Frequency Provider Last Rate Last Dose  . acetaminophen (TYLENOL) tablet 650 mg  650 mg Oral Q6H PRN Kerrie Buffalo, NP      . alum & mag hydroxide-simeth (MAALOX/MYLANTA) 200-200-20 MG/5ML suspension 30 mL  30 mL Oral Q4H PRN Kerrie Buffalo, NP      . hydrOXYzine (ATARAX/VISTARIL) tablet 25 mg  25 mg Oral Q6H PRN Kerrie Buffalo, NP   25 mg at 11/14/16 2122  . loperamide (IMODIUM) capsule 2-4 mg  2-4 mg Oral PRN Kerrie Buffalo, NP      . LORazepam (ATIVAN) tablet 1 mg  1 mg Oral Q6H PRN Kerrie Buffalo, NP      . magnesium hydroxide (MILK OF MAGNESIA) suspension 30 mL  30 mL Oral Daily PRN Kerrie Buffalo, NP      . multivitamin with minerals tablet 1 tablet  1 tablet Oral Daily Kerrie Buffalo, NP   1 tablet at 11/15/16 [phone]  . nicotine (NICODERM CQ - dosed in mg/24 hours) patch 21 mg  21 mg Transdermal Daily Kerrie Buffalo, NP      . thiamine (B-1) injection 100 mg  100 mg Intramuscular Once Kerrie Buffalo, NP      . thiamine (VITAMIN B-1) tablet 100 mg  100 mg Oral Daily Kerrie Buffalo, NP   100 mg at 11/15/16 0811  . traZODone (DESYREL) tablet 50 mg  50 mg Oral QHS PRN Kerrie Buffalo, NP   50 mg at 11/14/16 2122  . venlafaxine XR (EFFEXOR-XR) 24 hr capsule 150 mg  150 mg Oral Daily Kerrie Buffalo, NP   150 mg at 11/15/16 9379    PTA Medications: Prescriptions Prior to Admission  Medication Sig Dispense Refill Last Dose  . Travoprost, BAK Free, (TRAVATAN Z) 0.004 % SOLN ophthalmic solution INT 1 GTT IN OU QHS   11/13/2016 at Unknown time  . trazodone (DESYREL) 300 MG tablet Take 50-75 mg by mouth at bedtime.    11/13/2016 at Unknown  time  . venlafaxine XR (EFFEXOR XR) 150 MG 24 hr capsule Take 1 capsule (150 mg total) by mouth daily with breakfast. 30 capsule 2 11/13/2016 at Unknown time    Treatment Modalities: Medication Management, Group therapy, Case management,  1 to 1 session with clinician, Psychoeducation, Recreational therapy.  Patient Stressors: Financial difficulties Loss of mother by suicide and father of liver failure Marital or family conflict Substance abuse  Patient Strengths: Curator fund of knowledge Motivation for treatment/growth Physical Health Work Artist for Primary Diagnosis: <principal problem not specified> Long Term Goal(s): Improvement in symptoms so as ready for discharge  Short Term Goals: Ability to identify changes in lifestyle to reduce recurrence of condition will improve Ability to verbalize feelings will improve Ability to disclose and discuss suicidal ideas Ability to demonstrate self-control will improve Ability to identify and develop effective coping behaviors will improve Compliance with prescribed medications will improve Ability to identify triggers associated with substance abuse/mental health issues will improve  Medication Management: Evaluate patient's response, side effects, and tolerance of medication regimen.  Therapeutic Interventions: 1 to 1 sessions, Unit Group sessions and Medication administration.  Evaluation of Outcomes: Not Met  Physician Treatment Plan for Secondary Diagnosis: Active Problems:  Substance induced mood disorder ([place])   Long Term Goal(s): Improvement in symptoms so as ready for discharge  Short Term Goals: Ability to identify changes in lifestyle to reduce recurrence of condition will improve Ability to verbalize feelings will improve Ability to disclose and discuss suicidal ideas Ability to demonstrate self-control will improve Ability to identify and develop effective coping  behaviors will improve Compliance with prescribed medications will improve Ability to identify triggers associated with substance abuse/mental health issues will improve  Medication Management: Evaluate patient's response, side effects, and tolerance of medication regimen.  Therapeutic Interventions: 1 to 1 sessions, Unit Group sessions and Medication administration.  Evaluation of Outcomes: Not Met   RN Treatment Plan for Primary Diagnosis: <principal problem not specified> Long Term Goal(s): Knowledge of disease and therapeutic regimen to maintain health will improve  Short Term Goals: Ability to verbalize feelings will improve, Ability to disclose and discuss suicidal ideas and Ability to identify and develop effective coping behaviors will improve  Medication Management: RN will administer medications as ordered by provider, will assess and evaluate patient's response and provide education to patient for prescribed medication. RN will report any adverse and/or side effects to prescribing provider.  Therapeutic Interventions: 1 on 1 counseling sessions, Psychoeducation, Medication administration, Evaluate responses to treatment, Monitor vital signs and CBGs as ordered, Perform/monitor CIWA, COWS, AIMS and Fall Risk screenings as ordered, Perform wound care treatments as ordered.  Evaluation of Outcomes: Not Met   LCSW Treatment Plan for Primary Diagnosis: <principal problem not specified> Long Term Goal(s): Safe transition to appropriate next level of care at discharge, Engage patient in therapeutic group addressing interpersonal concerns.  Short Term Goals: Engage patient in aftercare planning with referrals and resources, Identify triggers associated with mental health/substance abuse issues and Increase skills for wellness and recovery  Therapeutic Interventions: Assess for all discharge needs, 1 to 1 time with Social worker, Explore available resources and support systems, Assess  for adequacy in community support network, Educate family and significant other(s) on suicide prevention, Complete Psychosocial Assessment, Interpersonal group therapy.  Evaluation of Outcomes: Not Met   Progress in Treatment: Attending groups: Pt is new to milieu, continuing to assess  Participating in groups: Pt is new to milieu, continuing to assess  Taking medication as prescribed: Yes, MD continues to assess for medication changes as needed Toleration medication: Yes, no side effects reported at this time Family/Significant other contact made: No, Pt declines Patient understands diagnosis: Continuing to assess Discussing patient identified problems/goals with staff: Yes Medical problems stabilized or resolved: Yes Denies suicidal/homicidal ideation: Yes Issues/concerns per patient self-inventory: None Other: N/A  New problem(s) identified: None identified at this time.   New Short Term/Long Term Goal(s): None identified at this time.   Discharge Plan or Barriers: Pt plans to return home and will follow-up with his established therapist at [place] and his PCP through [facility]  Reason for Continuation of Hospitalization: Anxiety Depression Medication stabilization Withdrawal symptoms  Estimated Length of Stay: 2-3 days  Attendees: Patient: 11/15/2016  4:40 PM  Physician: Dr. Parke Poisson 11/15/2016  4:40 PM  Nursing: Birdie Hopes, RN 11/15/2016  4:40 PM  RN Care Manager: Lars Pinks, RN 11/15/2016  4:40 PM  Social Worker: Adriana Reams, LCSW; Matthew Saras, [place] 11/15/2016  4:40 PM  Recreational Therapist:  11/15/2016  4:40 PM  Other: Lindell Spar, NP; Samuel Jester, NP 11/15/2016  4:40 PM  Other:  11/15/2016  4:40 PM  Other: 11/15/2016  4:40 PM    Scribe for  Treatment Team: Gladstone Lighter, LCSW 11/15/2016 4:40 PM

## 2016-11-15 NOTE — H&P (Signed)
Psychiatric Admission Assessment Adult  Patient Identification: Reginald Bentley  MRN:  [id number]  Date of Evaluation:  11/15/2016  Chief Complaint:  MDD  ALCOHOL ABUSE DISORDER SEVERE  Principal Diagnosis: Alcohol use disorder, severe, dependence  Diagnosis:   Patient Active Problem List   Diagnosis Date Noted  . Alcohol use disorder, severe, dependence ([place]) [F10.20] 11/14/2016    Priority: High  . Major depression, recurrent ([place]) [F33.9] 11/14/2016  . Substance induced mood disorder ([place]) [F19.94] 11/14/2016  . Anxiety [F41.9] 10/11/2016  . Depression [F32.9] 10/11/2016  . Tobacco abuse [Z72.0] 10/11/2016  . 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 Insomnia [G47.00] 10/11/2016   History of Present Illness: This is an admission assessment for this 36 year old Caucasian male with hx of alcoholism. He is admitted to the [facility] adult unit from the [facility] with complaints of alcohol intoxication & suicidal threats. He apparent had received alcohol detoxification treatment from the [facility] in February of 2018. After discharge, received substance abuse treatment at the [facility]. He spent 30 days. He was discharged to continue substance abuse treatment & counseling sessions on an outpatient basis at the [facility].   During this assessment, "Reginald Bentley reports, "The cops took me to the ED 2 days ago. My wife called them. She told the cops that I was threatening to kill myself. That was not true. I went to the beach last weekend with the boys. I drank some alcohol with the boys. My wife found out & was upset about it. As I was lying down on the couch resting on Tuesday, my wife called the cops, they came & took me to the hospital because my wife lied that I was threatening suicide. I'm not suicidal, homicidal, hearing voices or seeing things. I was not depressed & I'm still not depressed. I do not need to be here. I'm anxious about my job, I  will likely lose my job as I have not been there in 2 days. I'm determine to get back with my sponsor to resume my weekly AA meetings. I need to be discharge.".  Associated Signs/Symptoms:  Depression Symptoms:  "I'm not really depressed, rather sad that I'm in here"  (Hypo) Manic Symptoms:  Denies any hypomanic symptoms, however, presents as frustrated about being brought to the hospital.  Anxiety Symptoms:  Excessive worry, concerned may lose his job for not being at work for 2 days.  Psychotic Symptoms:  Denies any hallucinations, delusional thoughts or paranoia.  PTSD Symptoms: "My mother died of intentional drug overdose". Re-experiencing:  Flashbacks  Total Time spent with patient: 1 hour  Past Psychiatric History:   Is the patient at risk to self? No.  Has the patient been a risk to self in the past 6 months? Yes.    Has the patient been a risk to self within the distant past? Yes.    Is the patient a risk to others? No.  Has the patient been a risk to others in the past 6 months? No.  Has the patient been a risk to others within the distant past? No.   Prior Inpatient Therapy: Yes ([facility]). Prior Outpatient Therapy:Yes, [facility].  Alcohol Screening: 1. How often do you have a drink containing alcohol?: Monthly or less 2. How many drinks containing alcohol do you have on a typical day when you are drinking?: 5 or 6 3. How often do you have six or more drinks  on one occasion?: Less than monthly Preliminary Score: 3 4. How often during the last year have you found that you were not able to stop drinking once you had started?: Less than monthly 5. How often during the last year have you failed to do what was normally expected from you becasue of drinking?: Less than monthly 6. How often during the last year have you needed a first drink in the morning to get yourself going after a heavy drinking session?: Less than monthly 7. How often  during the last year have you had a feeling of guilt of remorse after drinking?: Less than monthly 8. How often during the last year have you been unable to remember what happened the night before because you had been drinking?: Monthly 9. Have you or someone else been injured as a result of your drinking?: No 10. Has a relative or friend or a doctor or another health worker been concerned about your drinking or suggested you cut down?: Yes, during the last year Alcohol Use Disorder Identification Test Final Score (AUDIT): 14 Brief Intervention: Yes  Substance Abuse History in the last 12 months:  Yes.    Consequences of Substance Abuse: Medical Consequences:  Liver damage, Possible death by overdose Legal Consequences:  Arrests, jail time, Loss of driving privilege. Family Consequences:  Family discord, divorce and or separation.  Previous Psychotropic Medications: Yes (Effexor & Trazodone".  Psychological Evaluations: No   Past Medical History:  Past Medical History:  Diagnosis Date  . Allergy   . Anxiety   . Depression   . Insomnia   . Seizures ([place])    17 years ago, was on a dilantin for 1 year  . Substance abuse     Past Surgical History:  Procedure Laterality Date  . WISDOM TOOTH EXTRACTION  2005   Family History:  Family History  Problem Relation Age of Onset  . Stroke Mother   . Drug abuse Mother   . Cirrhosis Father   . Alcohol abuse Sister   . Suicidality Maternal Grandfather    Family Psychiatric  History: Alcoholism: Father.                                                     Drug dependence: Mother.                                                    Completed suicide: Mother  Tobacco Screening: Have you used any form of tobacco in the last 30 days? (Cigarettes, Smokeless Tobacco, Cigars, and/or Pipes): Yes Tobacco use, Select all that apply: 5 or more cigarettes per day Are you interested in Tobacco Cessation Medications?: No, patient refused Counseled  patient on smoking cessation including recognizing danger situations, developing coping skills and basic information about quitting provided: Refused/Declined practical counseling  Social History:  History  Alcohol Use  . Yes    Comment: Last drink 08/03/2016     History  Drug Use  . Types: Marijuana, Cocaine    Comment: Last use 08/03/2016    Additional Social History: Pain Medications: See MAR Prescriptions: See MAR Over the Counter: See MAR History of alcohol / drug use?: Yes Negative  Consequences of Use: Financial, Legal, Personal relationships Withdrawal Symptoms: Agitation Name of Substance 1: Alcohol  1 - Age of First Use: UTA 1 - Amount (size/oz): 1 pint plus 4-5 1 - Frequency: "I have only been drinking for 4 days and was clean 100 days before that" 1 - Duration: 4 days 1 - Last Use / Amount: 11/13/16  Allergies:   Allergies  Allergen Reactions  . Banana Shortness Of Breath   Lab Results:  Results for orders placed or performed during the hospital encounter of 11/13/16 (from the past 48 hour(s))  Rapid urine drug screen (hospital performed)     Status: None   Collection Time: 11/14/16 12:24 AM  Result Value Ref Range   Opiates NONE DETECTED NONE DETECTED   Cocaine NONE DETECTED NONE DETECTED   Benzodiazepines NONE DETECTED NONE DETECTED   Amphetamines NONE DETECTED NONE DETECTED   Tetrahydrocannabinol NONE DETECTED NONE DETECTED   Barbiturates NONE DETECTED NONE DETECTED    Comment:        DRUG SCREEN FOR MEDICAL PURPOSES ONLY.  IF CONFIRMATION IS NEEDED FOR ANY PURPOSE, NOTIFY LAB WITHIN 5 DAYS.        LOWEST DETECTABLE LIMITS FOR URINE DRUG SCREEN Drug Class       Cutoff (ng/mL) Amphetamine      1000 Barbiturate      200 Benzodiazepine   165 Tricyclics       790 Opiates          300 Cocaine          300 THC              50   Comprehensive metabolic panel     Status: Abnormal   Collection Time: 11/14/16 12:36 AM  Result Value Ref Range   Sodium 143  135 - 145 mmol/L   Potassium 3.7 3.5 - 5.1 mmol/L   Chloride 106 101 - 111 mmol/L   CO2 27 22 - 32 mmol/L   Glucose, Bld 101 (H) 65 - 99 mg/dL   BUN 12 6 - 20 mg/dL   Creatinine, Ser 0.81 0.61 - 1.24 mg/dL   Calcium 8.5 (L) 8.9 - 10.3 mg/dL   Total Protein 6.6 6.5 - 8.1 g/dL   Albumin 4.1 3.5 - 5.0 g/dL   AST 32 15 - 41 U/L   ALT 25 17 - 63 U/L   Alkaline Phosphatase 54 38 - 126 U/L   Total Bilirubin 0.3 0.3 - 1.2 mg/dL   GFR calc non Af Amer >60 >60 mL/min   GFR calc Af Amer >60 >60 mL/min    Comment: (NOTE) The eGFR has been calculated using the CKD EPI equation. This calculation has not been validated in all clinical situations. eGFR's persistently <60 mL/min signify possible Chronic Kidney Disease.    Anion gap 10 5 - 15  Ethanol     Status: Abnormal   Collection Time: 11/14/16 12:36 AM  Result Value Ref Range   Alcohol, Ethyl (B) 175 (H) <5 mg/dL    Comment:        LOWEST DETECTABLE LIMIT FOR SERUM ALCOHOL IS 5 mg/dL FOR MEDICAL PURPOSES ONLY   Salicylate level     Status: None   Collection Time: 11/14/16 12:36 AM  Result Value Ref Range   Salicylate Lvl <3.8 2.8 - 30.0 mg/dL  Acetaminophen level     Status: Abnormal   Collection Time: 11/14/16 12:36 AM  Result Value Ref Range   Acetaminophen (Tylenol), Serum <10 (L) 10 -  30 ug/mL    Comment:        THERAPEUTIC CONCENTRATIONS VARY SIGNIFICANTLY. A RANGE OF 10-30 ug/mL MAY BE AN EFFECTIVE CONCENTRATION FOR MANY PATIENTS. HOWEVER, SOME ARE BEST TREATED AT CONCENTRATIONS OUTSIDE THIS RANGE. ACETAMINOPHEN CONCENTRATIONS >150 ug/mL AT 4 HOURS AFTER INGESTION AND >50 ug/mL AT 12 HOURS AFTER INGESTION ARE OFTEN ASSOCIATED WITH TOXIC REACTIONS.   cbc     Status: None   Collection Time: 11/14/16 12:36 AM  Result Value Ref Range   WBC 7.0 4.0 - 10.5 K/uL   RBC 4.69 4.22 - 5.81 MIL/uL   Hemoglobin 13.4 13.0 - 17.0 g/dL   HCT 40.0 39.0 - 52.0 %   MCV 85.3 78.0 - 100.0 fL   MCH 28.6 26.0 - 34.0 pg   MCHC 33.5  30.0 - 36.0 g/dL   RDW 13.1 11.5 - 15.5 %   Platelets 300 150 - 400 K/uL   Blood Alcohol level:  Lab Results  Component Value Date   ETH 175 (H) 20/94/7096   Metabolic Disorder Labs:  No results found for: HGBA1C, MPG No results found for: PROLACTIN No results found for: CHOL, TRIG, HDL, CHOLHDL, VLDL, LDLCALC  Current Medications: Current Facility-Administered Medications  Medication Dose Route Frequency Provider Last Rate Last Dose  . acetaminophen (TYLENOL) tablet 650 mg  650 mg Oral Q6H PRN Kerrie Buffalo, NP      . alum & mag hydroxide-simeth (MAALOX/MYLANTA) 200-200-20 MG/5ML suspension 30 mL  30 mL Oral Q4H PRN Kerrie Buffalo, NP      . hydrOXYzine (ATARAX/VISTARIL) tablet 25 mg  25 mg Oral Q6H PRN Kerrie Buffalo, NP   25 mg at 11/14/16 2122  . loperamide (IMODIUM) capsule 2-4 mg  2-4 mg Oral PRN Kerrie Buffalo, NP      . LORazepam (ATIVAN) tablet 1 mg  1 mg Oral Q6H PRN Kerrie Buffalo, NP      . magnesium hydroxide (MILK OF MAGNESIA) suspension 30 mL  30 mL Oral Daily PRN Kerrie Buffalo, NP      . multivitamin with minerals tablet 1 tablet  1 tablet Oral Daily Kerrie Buffalo, NP   1 tablet at 11/15/16 [id number]  . nicotine (NICODERM CQ - dosed in mg/24 hours) patch 21 mg  21 mg Transdermal Daily Kerrie Buffalo, NP      . thiamine (B-1) injection 100 mg  100 mg Intramuscular Once Kerrie Buffalo, NP      . thiamine (VITAMIN B-1) tablet 100 mg  100 mg Oral Daily Kerrie Buffalo, NP   100 mg at 11/15/16 0811  . traZODone (DESYREL) tablet 50 mg  50 mg Oral QHS PRN Kerrie Buffalo, NP   50 mg at 11/14/16 2122  . venlafaxine XR (EFFEXOR-XR) 24 hr capsule 150 mg  150 mg Oral Daily Kerrie Buffalo, NP   150 mg at 11/15/16 6294   PTA Medications: Prescriptions Prior to Admission  Medication Sig Dispense Refill Last Dose  . Travoprost, BAK Free, (TRAVATAN Z) 0.004 % SOLN ophthalmic solution INT 1 GTT IN OU QHS   11/13/2016 at Unknown time  . trazodone (DESYREL) 300 MG tablet Take  50-75 mg by mouth at bedtime.    11/13/2016 at Unknown time  . venlafaxine XR (EFFEXOR XR) 150 MG 24 hr capsule Take 1 capsule (150 mg total) by mouth daily with breakfast. 30 capsule 2 11/13/2016 at Unknown time   Musculoskeletal: Strength & Muscle Tone: within normal limits Gait & Station: normal Patient leans: N/A  Psychiatric Specialty Exam: Physical Exam  Constitutional: He is oriented to person, place, and time. He appears well-developed.  HENT:  Head: Normocephalic.  Eyes: Pupils are equal, round, and reactive to light.  Neck: Normal range of motion.  Cardiovascular: Normal rate.   Respiratory: Effort normal.  GI: Soft.  Genitourinary:  Genitourinary Comments: Deferred  Musculoskeletal: Normal range of motion.  Neurological: He is alert and oriented to person, place, and time.  Skin: Skin is warm and dry.    Review of Systems  Constitutional: Positive for chills and diaphoresis.  HENT: Negative.   Eyes: Negative.   Cardiovascular: Negative.   Gastrointestinal: Positive for nausea.  Genitourinary: Negative.   Musculoskeletal: Negative.   Skin: Negative.   Neurological: Negative.   Endo/Heme/Allergies: Negative.   Psychiatric/Behavioral: Positive for depression, substance abuse (BAL 175) and suicidal ideas. Negative for hallucinations and memory loss. The patient is nervous/anxious and has insomnia.     Blood pressure (!) 143/83, pulse 88, temperature 98.4 F (36.9 C), temperature source Oral, resp. rate 16, height 6' 3"  (1.905 m), weight 84.4 kg (186 lb), SpO2 98 %.Body mass index is 23.25 kg/m.  General Appearance: Casual and Fairly Groomed  Eye Contact:  Good  Speech:  Clear and Coherent and Normal Rate  Volume:  Normal  Mood:  Denies any symptoms of depression, however, says he is anxious & sad about being brought to the hospital  Affect:  Flat & worried  Thought Process:  Coherent, Goal Directed, Linear and Descriptions of Associations: Intact  Orientation:   Full (Time, Place, and Person)  Thought Content:  Denies any hallucinations, delusional thoughts or paranoia.  Suicidal Thoughts:  Denies any thoughts, plans or intent.  Homicidal Thoughts:  Denies any thoughts, plans or intent.  Memory:  Immediate;   Good Recent;   Good Remote;   Good  Judgement:  Fair  Insight:  Shallow  Psychomotor Activity:  Normal  Concentration:  Concentration: Fair and Attention Span: Fair  Recall:  Good  Fund of Knowledge:  Fair  Language:  Good  Akathisia:  No  Handed:  Right  AIMS (if indicated):     Assets:  Communication Skills Desire for Improvement  ADL's:  Intact  Cognition:  WNL  Sleep:  Number of Hours: 6.75    Treatment Plan/Recommendations: 1. Admit for crisis management and stabilization, estimated length of stay 3-5 days.  2. Medication management to reduce current symptoms to base line and improve the patient's overall level of functioning: Continue Effexor XR 150 mg for depression, Nicotine patch 21 mg for nicotine withdrawal symptoms, Trazodone 50 mg prn, Continue the CIWA.  3. Treat health problems as indicated.  4. Develop treatment plan to decrease risk of relapse upon discharge and the need for readmission.  5. Psycho-social education regarding relapse prevention and self care.  6. Health care follow up as needed for medical problems.  7. Review, reconcile, and reinstate any pertinent home medications for other health issues where appropriate. 8. Call for consults with hospitalist for any additional specialty patient care services as needed.  Observation Level/Precautions:  15 minute checks  Laboratory:  Per ED, BAL 175  Psychotherapy: Group session    Medications: See MAR    Consultations: As needed  Discharge Concerns:  Safety, mood stability  Estimated LOS:  Other:     Physician Treatment Plan for Primary Diagnosis: <principal problem not specified>  \Long Term Goal(s): Improvement in symptoms so as ready for  discharge  Short Term Goals: Ability to identify changes in lifestyle to reduce  recurrence of condition will improve, Ability to verbalize feelings will improve, Ability to disclose and discuss suicidal ideas and Ability to demonstrate self-control will improve  Physician Treatment Plan for Secondary Diagnosis: Active Problems:   Substance induced mood disorder ([place])  Long Term Goal(s): Improvement in symptoms so as ready for discharge  Short Term Goals: Ability to identify and develop effective coping behaviors will improve, Compliance with prescribed medications will improve and Ability to identify triggers associated with substance abuse/mental health issues will improve  I certify that inpatient services furnished can reasonably be expected to improve the patient's condition.    Lindell Spar I, NP 5/17/201811:02 AM

## 2016-11-15 NOTE — Progress Notes (Signed)
Patient ID: Reginald GamblesJohn R Leinbach, male   DOB: 10/11/1980, 36 y.o.   MRN: [id number] D: Client visible on the unit, seen in dayroom interacting with peers. Client is pleasant, reports "I really shouldn't be here but my wife told some story" "I had been drinking, I relapse" "trying to get back to what I was doing seeing my sponsor and going to AA" Goal: "to leave here tomorrow" A: Writer provided emotional support encouraged client to continue recovery. Medications reviewed, administered as ordered. Staff will monitor q3515min for safety. R:Client is safe on the unit, attended karaoke.

## 2016-11-15 NOTE — Progress Notes (Signed)
Pt is a new admit to the unit this afternoon.  He reports that he recently relapsed on alcohol after being sober for a while.  He was just at [facility].  His wife petitioned to have him committed after he made some statements that he was going to drink himself to death.  He denies SI/HI/AVH at this time.  He denies having any withdrawal symptoms this evening, although he chose to stay in bed most of the evening.  Writer reviewed meds that were available to him and encouraged him to report any symptoms he may develop so that he can be medicated.  Pt voiced understanding.  Support and encouragement offered.  Discharge plans are in process.  Safety maintained with q15 minute checks.

## 2016-11-15 NOTE — [facility] Suicide Risk Assessment (Signed)
[facility] Admission Suicide Risk Assessment   Nursing information obtained from:  Patient Demographic factors:  Male, Caucasian Current Mental Status:  NA Loss Factors:  Financial problems / change in socioeconomic status Historical Factors:  Family history of suicide, Family history of mental illness or substance abuse Risk Reduction Factors:  NA  Total Time spent with patient: 45 minutes Principal Problem: <principal problem not specified> Diagnosis:   Patient Active Problem List   Diagnosis Date Noted  . Major depression, recurrent (HCC) [F33.9] 11/14/2016  . Alcohol use disorder, severe, dependence (HCC) [F10.20] 11/14/2016  . Substance induced mood disorder (HCC) [F19.94] 11/14/2016  . Anxiety [F41.9] 10/11/2016  . Depression [F32.9] 10/11/2016  . Tobacco abuse [Z72.0] 10/11/2016  . 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 Insomnia [G47.00] 10/11/2016    Continued Clinical Symptoms:  Alcohol Use Disorder Identification Test Final Score (AUDIT): 14 The "Alcohol Use Disorders Identification Test", Guidelines for Use in Primary Care, Second Edition.  World Science writer [facility]). Score between 0-7:  no or low risk or alcohol related problems. Score between 8-15:  moderate risk of alcohol related problems. Score between 16-19:  high risk of alcohol related problems. Score 20 or above:  warrants further diagnostic evaluation for alcohol dependence and treatment.   CLINICAL FACTORS:  36 year old married male. Has one biological son and one stepson. Employed .  Admitted under commitment generated by wife, reporting that he was expressing suicidal ideations with thoughts of suicide by drinking self to death. Chart notes also note that wife reported patient had stolen a friend's car. Patient states that he is not suicidal and that he has no recollection of having made any suicidal statements. Minimizes depression, and denies any neuro-vegetative symptoms. Regarding above, states  " a friend lent me his car, I did not steal anything".  He states he feels this is related to marital conflict and that his wife is " trying to mess with me" He reports history of alcohol dependence. States he had relapsed last weekend, after a period of several months of sobriety. He had been drinking over the 2-3 days prior to admission and admission BAL 175. UDS negative . States he has had prior admissions for detox and rehab- [facility]. Reports he has a history of depression related to loss of loved ones in the past . He has never attempted suicide and states he would " never take myself away  from my son."   Dx- Alcohol Dependence . Consider alcohol induced mood disorder, depressed   Plan- inpatient admission . Ativan PRNs for potential WDL. Continue Effexor XR 150 mgrs QDAY     Musculoskeletal: Strength & Muscle Tone: within normal limits- no tremors, no diaphoresis Gait & Station: normal Patient leans: N/A  Psychiatric Specialty Exam: Physical Exam  ROS no chest pain, no shortness of breath, no vomiting   Blood pressure 127/86, pulse 85, temperature 98.4 F (36.9 C), temperature source Oral, resp. rate 16, height 6\' 3"  (1.905 m), weight 84.4 kg (186 lb), SpO2 98 %.Body mass index is 23.25 kg/m.  General Appearance: Fairly Groomed  Eye Contact:  Fair  Speech:  Normal Rate  Volume:  Normal  Mood:  minimizes depression at this time  Affect:  appropriate, slilghtly depressed   Thought Process:  Linear  Orientation:  Other:  fully alert and attentive   Thought Content:  denies hallucinations, no delusions   Suicidal Thoughts:  No Denies any suicidal ideations, or any self injurious ideations   Homicidal  Thoughts:  No Denies any homicidal or violent ideations, specifically also denies any homicidal or violent ideations towards his wife   Memory:  recent and remote grossly intact   Judgement:  Fair  Insight:  Fair  Psychomotor Activity:  Normal- no symptoms of withdrawal  at this time  Concentration:  Concentration: Good and Attention Span: Good  Recall:  Good  Fund of Knowledge:  Good  Language:  Good  Akathisia:  Negative  Handed:  Right  AIMS (if indicated):     Assets:  Communication Skills Desire for Improvement Resilience  ADL's:  Intact  Cognition:  WNL  Sleep:  Number of Hours: 6.75      COGNITIVE FEATURES THAT CONTRIBUTE TO RISK:  Closed-mindedness and Loss of executive function    SUICIDE RISK:   Mild:  Suicidal ideation of limited frequency, intensity, duration, and specificity.  There are no identifiable plans, no associated intent, mild dysphoria and related symptoms, good self-control (both objective and subjective assessment), few other risk factors, and identifiable protective factors, including available and accessible social support.  PLAN OF CARE: Patient will be admitted to inpatient psychiatric unit for stabilization and safety. Will provide and encourage milieu participation. Provide medication management and maked adjustments as needed.  Will follow daily.    I certify that inpatient services furnished can reasonably be expected to improve the patient's condition.   Craige CottaFernando A Kaiah Hosea, MD 11/15/2016, 4:49 PM

## 2016-11-16 DIAGNOSIS — F339 Major depressive disorder, recurrent, unspecified: Secondary | ICD-10-CM

## 2016-11-16 DIAGNOSIS — F1490 Cocaine use, unspecified, uncomplicated: Secondary | ICD-10-CM

## 2016-11-16 DIAGNOSIS — Z813 Family history of other psychoactive substance abuse and dependence: Secondary | ICD-10-CM

## 2016-11-16 DIAGNOSIS — F1290 Cannabis use, unspecified, uncomplicated: Secondary | ICD-10-CM

## 2016-11-16 DIAGNOSIS — Z818 Family history of other mental and behavioral disorders: Secondary | ICD-10-CM

## 2016-11-16 DIAGNOSIS — F17210 Nicotine dependence, cigarettes, uncomplicated: Secondary | ICD-10-CM

## 2016-11-16 DIAGNOSIS — F1020 Alcohol dependence, uncomplicated: Secondary | ICD-10-CM

## 2016-11-16 DIAGNOSIS — F1994 Other psychoactive substance use, unspecified with psychoactive substance-induced mood disorder: Secondary | ICD-10-CM

## 2016-11-16 DIAGNOSIS — G4700 Insomnia, unspecified: Secondary | ICD-10-CM

## 2016-11-16 DIAGNOSIS — Z811 Family history of alcohol abuse and dependence: Secondary | ICD-10-CM

## 2016-11-16 NOTE — Progress Notes (Signed)
  [facility] :  Will you be returning to the same living situation after discharge:  Yes,  Pt returning home At discharge, do you have transportation home?: Yes,  Pt friend to pick up Do you have the ability to pay for your medications: Yes,  Pt provided with prescriptions  Release of information consent forms completed and in the chart;  Patient's signature needed at discharge.  Patient to Follow up at: Follow-up Information    [facility] Follow up on 11/20/2016.   Why:  at 8:00am for therapy with Victorino DikeJennifer.  Contact information: [address] [place] [address] [phone]        [facility] Follow up on 11/23/2016.   Specialty:  Family Medicine Why:  at 11:00am for medication management with Woodward KuSamantha Wirley.  Contact information: [address] [place] [phone]          Next level of care provider has access to [facility] Link:no  Safety Planning and Suicide Prevention discussed: Yes,  with Pt; declines family contact  Have you used any form of tobacco in the last 30 days? (Cigarettes, Smokeless Tobacco, Cigars, and/or Pipes): Yes  Has patient been referred to the Quitline?: Patient refused referral  Patient has been referred for addiction treatment: Yes  Verdene LennertLauren C Forever Arechiga 11/16/2016, 4:26 PM

## 2016-11-16 NOTE — Progress Notes (Signed)
Pt did not attend Karaoke group, pt remained in his room.

## 2016-11-16 NOTE — Progress Notes (Signed)
[facility] MD Progress Note  11/16/2016 3:46 PM LINDEN TAGLIAFERRO  MRN:  [id number]  Subjective: Reginald Bentley reports, "I'm worried for being in this hospital till today. I have got to go home or I will lose my job. There is nothing wrong with me. My wife did this to me. She is trying to get back at me for drinking with the boys over the last weekend. No, it has not been a good 2 days for me".  Objective: Reginald Bentley is seen, chart reviewed. He is alert, oriented x 4. He presents restless, worried & uneasy today. He is blaming his wife for calling the cops on him & putting him in the hospital. He says he does not need to be in the hospital when he was already making effort to resume AA/NA weekly meetings again. He also says he plans to join the [employer] being held in his church. Husam currently denies any SIHI, AVH, delusional thoughts or paranoia. However, he continues to cast blame on his wife for making him come to the hospital when he does not need to do. Beni is not owning his own mistake. He is visible on the unit, attending groups. He does not appear to be responding to any internal stimuli.  Principal Problem: Alcohol use disorder, severe, dependence.  Diagnosis:   Patient Active Problem List   Diagnosis Date Noted  . Alcohol use disorder, severe, dependence (HCC) [F10.20] 11/14/2016    Priority: High  . Major depression, recurrent (HCC) [F33.9] 11/14/2016  . Substance induced mood disorder (HCC) [F19.94] 11/14/2016  . Anxiety [F41.9] 10/11/2016  . Depression [F32.9] 10/11/2016  . Tobacco abuse [Z72.0] 10/11/2016  . 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 Insomnia [G47.00] 10/11/2016   Total Time spent with patient: 25 minutes  Past Psychiatric History: Alcoholism, chronic.  Past Medical History:  Past Medical History:  Diagnosis Date  . Allergy   . Anxiety   . Depression   . Insomnia   . Seizures (HCC)    17 years ago, was on a dilantin for 1 year  . Substance abuse      Past Surgical History:  Procedure Laterality Date  . WISDOM TOOTH EXTRACTION  2005   Family History:  Family History  Problem Relation Age of Onset  . Stroke Mother   . Drug abuse Mother   . Cirrhosis Father   . Alcohol abuse Sister   . Suicidality Maternal Grandfather    Family Psychiatric  History:See H&P Social History:  History  Alcohol Use  . Yes    Comment: Last drink 08/03/2016     History  Drug Use  . Types: Marijuana, Cocaine    Comment: Last use 08/03/2016    Social History   Social History  . Marital status: Married    Spouse name: N/A  . Number of children: N/A  . Years of education: N/A   Social History Main Topics  . Smoking status: Current Every Day Smoker    Packs/day: 0.50    Types: Cigarettes  . Smokeless tobacco: Never Used  . Alcohol use Yes     Comment: Last drink 08/03/2016  . Drug use: Yes    Types: Marijuana, Cocaine     Comment: Last use 08/03/2016  . Sexual activity: Yes   Other Topics Concern  . None   Social History Narrative   Work at [employer] in [employer]   Married, 2 children      Additional Social  History:    Pain Medications: See MAR Prescriptions: See MAR Over the Counter: See MAR History of alcohol / drug use?: Yes Negative Consequences of Use: Financial, Legal, Personal relationships Withdrawal Symptoms: Agitation Name of Substance 1: Alcohol  1 - Age of First Use: UTA 1 - Amount (size/oz): 1 pint plus 4-5 1 - Frequency: "I have only been drinking for 4 days and was clean 100 days before that" 1 - Duration: 4 days 1 - Last Use / Amount: 11/13/16  Sleep: Good  Appetite:  Good  Current Medications: Current Facility-Administered Medications  Medication Dose Route Frequency Provider Last Rate Last Dose  . acetaminophen (TYLENOL) tablet 650 mg  650 mg Oral Q6H PRN Adonis BrookAgustin, Sheila, NP      . alum & mag hydroxide-simeth (MAALOX/MYLANTA) 200-200-20 MG/5ML suspension 30 mL  30 mL Oral Q4H PRN Adonis BrookAgustin,  Sheila, NP      . hydrOXYzine (ATARAX/VISTARIL) tablet 25 mg  25 mg Oral Q6H PRN Adonis BrookAgustin, Sheila, NP   25 mg at 11/14/16 2122  . loperamide (IMODIUM) capsule 2-4 mg  2-4 mg Oral PRN Adonis BrookAgustin, Sheila, NP      . LORazepam (ATIVAN) tablet 1 mg  1 mg Oral Q6H PRN Adonis BrookAgustin, Sheila, NP      . magnesium hydroxide (MILK OF MAGNESIA) suspension 30 mL  30 mL Oral Daily PRN Adonis BrookAgustin, Sheila, NP      . multivitamin with minerals tablet 1 tablet  1 tablet Oral Daily Adonis BrookAgustin, Sheila, NP   1 tablet at 11/16/16 [phone]  . nicotine (NICODERM CQ - dosed in mg/24 hours) patch 21 mg  21 mg Transdermal Daily Adonis BrookAgustin, Sheila, NP   21 mg at 11/16/16 0829  . thiamine (B-1) injection 100 mg  100 mg Intramuscular Once Adonis BrookAgustin, Sheila, NP      . thiamine (VITAMIN B-1) tablet 100 mg  100 mg Oral Daily Adonis BrookAgustin, Sheila, NP   100 mg at 11/16/16 0829  . traZODone (DESYREL) tablet 50 mg  50 mg Oral QHS PRN Adonis BrookAgustin, Sheila, NP   50 mg at 11/15/16 2138  . venlafaxine XR (EFFEXOR-XR) 24 hr capsule 150 mg  150 mg Oral Daily Adonis BrookAgustin, Sheila, NP   150 mg at 11/16/16 [id number]    Lab Results: No results found for this or any previous visit (from the past 48 hour(s)).  Blood Alcohol level:  Lab Results  Component Value Date   ETH 175 (H) 11/14/2016    Metabolic Disorder Labs: No results found for: HGBA1C, MPG No results found for: PROLACTIN No results found for: CHOL, TRIG, HDL, CHOLHDL, VLDL, LDLCALC  Physical Findings: AIMS: Facial and Oral Movements Muscles of Facial Expression: None, normal Lips and Perioral Area: None, normal Jaw: None, normal Tongue: None, normal,Extremity Movements Upper (arms, wrists, hands, fingers): None, normal Lower (legs, knees, ankles, toes): None, normal, Trunk Movements Neck, shoulders, hips: None, normal, Overall Severity Severity of abnormal movements (highest score from questions above): None, normal Incapacitation due to abnormal movements: None, normal Patient's awareness of abnormal movements  (rate only patient's report): No Awareness, Dental Status Current problems with teeth and/or dentures?: No Does patient usually wear dentures?: No  CIWA:  CIWA-Ar Total: 0 COWS:     Musculoskeletal: Strength & Muscle Tone: within normal limits Gait & Station: normal Patient leans: N/A  Psychiatric Specialty Exam: Physical Exam: Nurses notes & Vital signs reviewed.  Review of Systems  Genitourinary: Negative.   Psychiatric/Behavioral: Positive for depression ("improving") and substance abuse (Hx. alcoholism). Negative for hallucinations, memory loss  and suicidal ideas. The patient is nervous/anxious ("Improving"). The patient does not have insomnia.     Blood pressure 117/82, pulse 74, temperature 98 F (36.7 C), temperature source Oral, resp. rate 16, height 6\' 3"  (1.905 m), weight 84.4 kg (186 lb), SpO2 98 %.Body mass index is 23.25 kg/m.  General Appearance: Fairly Groomed  Eye Contact:  Fair  Speech:  Normal Rate  Volume:  Normal  Mood:  minimizes depression at this time  Affect:  appropriate, slilghtly depressed   Thought Process:  Linear  Orientation:  Other:  fully alert and attentive   Thought Content:  denies hallucinations, no delusions   Suicidal Thoughts:  No Denies any suicidal ideations, or any self injurious ideations   Homicidal Thoughts:  No Denies any homicidal or violent ideations, specifically also denies any homicidal or violent ideations towards his wife   Memory:  recent and remote grossly intact   Judgement:  Fair  Insight:  Fair  Psychomotor Activity:  Normal- no symptoms of withdrawal at this time  Concentration:  Concentration: Good and Attention Span: Good  Recall:  Good  Fund of Knowledge:  Good  Language:  Good  Akathisia:  Negative  Handed:  Right  AIMS (if indicated):     Assets:  Communication Skills Desire for Improvement Resilience  ADL's:  Intact  Cognition:  WNL  Sleep:  Number of Hours: 6.75     Assessment: 36 year old married  male. Has one biological son and one stepson. Employed. Admitted under commitment generated by wife, reporting that he was expressing suicidal ideations with thoughts of suicide by drinking self to death. Chart notes also note that wife reported patient had stolen a friend's car. Patient states that he is not suicidal and that he has no recollection of having made any suicidal statements. Minimizes depression, and denies any neuro-vegetative symptoms. Regarding above, states " a friend lent me his car, I did not steal anything".  He states he feels this is related to marital conflict and that his wife is " trying to mess with me". He reports history of alcohol dependence.  Treatment Plan Summary: Patient is at his baseline. No evidence of psychosis. No evidence of mania. No dangerousness. We are finalizing aftercare.   Psychiatric: Alcohol use disorder, chronic. Substance Induced Mood Disorder  Alcohol detoxification treatments: Will continue the CIWA protocols.  Medical: Will continue to monitor for any symptoms & treat on a prn basis.  Psychosocial:  Employed, Married with children.  Will encourage group counseling attendance & participation.  PLAN: 1.11-16-16: No changes made on the current plan of care, continue current regimen as recommended.  Major depression:  Will continue Effexor XR 150 mg Q daily.  Insomnia" Will continue Trazodone 50 mg Q hs.  Nicotine Withdrawal symptoms: Will continue the nicotine patch 21 mg Q 24 hours..  2. Continue to monitor mood, behavior and interaction with peers.  Sanjuana Kava, NP, PMHNP, FNP-BC 11/16/2016, 3:46 PM

## 2016-11-16 NOTE — Progress Notes (Signed)
Recreation Therapy Notes  Date: 11/16/16 Time: 0930 Location: [address] Dayroom  Group Topic: Stress Management  Goal Area(s) Addresses:  Patient will verbalize importance of using healthy stress management.  Patient will identify positive emotions associated with healthy stress management.   Behavioral Response: Engaged  Intervention: Stress Management  Activity :  Guided Imagery.  LRT introduced the stress management technique of guided imagery.  LRT read a script to guide patients and engage them in the technique.  Patients were to follow along as LRT read script to fully participate in the technique.  Education:  Stress Management, Discharge Planning.   Education Outcome: Acknowledges edcuation/In group clarification offered/Needs additional education  Clinical Observations/Feedback: Pt attended group.   Caroll RancherMarjette Santa Abdelrahman, LRT/CTRS      Caroll RancherLindsay, Greydis Stlouis A 11/16/2016 12:03 PM

## 2016-11-16 NOTE — [facility] Group Notes (Signed)
[facility] LCSW Group Therapy 11/16/2016 1:15pm  Type of Therapy: Group Therapy- Feelings Around Relapse and Recovery  Participation Level: Active   Participation Quality:  Appropriate  Affect:  Appropriate  Cognitive: Alert and Oriented   Insight:  Developing   Engagement in Therapy: Developing/Improving and Engaged   Modes of Intervention: Clarification, Confrontation, Discussion, Education, Exploration, Limit-setting, Orientation, Problem-solving, Rapport Building, Dance movement psychotherapisteality Testing, Socialization and Support  Summary of Progress/Problems: The topic for today was feelings about relapse. The group discussed what relapse prevention is to them and identified triggers that they are on the path to relapse. Members also processed their feeling towards relapse and were able to relate to common experiences. Group also discussed coping skills that can be used for relapse prevention.  Pt was more engaged in group discussion and identified items related to recovery that he would like to implement such as improved positive thinking and setting boundaries at work.    Therapeutic Modalities:   Cognitive Behavioral Therapy Solution-Focused Therapy Assertiveness Training Relapse Prevention Therapy    Damien FusiLauren Amunique Neyra, LCSW [phone] 11/16/2016 3:36 PM

## 2016-11-16 NOTE — Plan of Care (Signed)
Problem: Safety: Goal: Periods of time without injury will increase Outcome: Progressing Periods of time without injury will increase AEB q5115min safety, client denies SI.

## 2016-11-16 NOTE — Progress Notes (Signed)
Nursing Note 11/16/2016 [id number]  Data Reports sleeping good with PRN sleep med.  Rates depression 1/10, hopelessness 1/10, and anxiety 2/10. Affect blunted but appropriate. Denies HI, SI, AVH.  Attending groups, spends free time in day area.    Action Spoke with patient 1:1, nurse offered support to patient throughout shift. .  Continues to be monitored on 15 minute checks for safety.  Response Remains safe and appropriate on unit.

## 2016-11-16 NOTE — Progress Notes (Signed)
The patient attended this evening's A.A.meeting and was appropriate.  

## 2016-11-17 MED ORDER — TRAZODONE HCL 50 MG PO TABS: 50.0000 mg | ORAL_TABLET | Freq: Every evening | ORAL | 0 refills | Status: AC | PRN

## 2016-11-17 MED ORDER — NICOTINE 21 MG/24HR TD PT24: 21.0000 mg | MEDICATED_PATCH | Freq: Every day | TRANSDERMAL | 0 refills | Status: DC

## 2016-11-17 MED ORDER — VENLAFAXINE HCL ER 150 MG PO CP24: 150.0000 mg | ORAL_CAPSULE | Freq: Every day | ORAL | 0 refills | Status: DC

## 2016-11-17 NOTE — Discharge Summary (Signed)
Physician Discharge Summary Note  Patient:  Reginald Bentley is an 36 y.o., male MRN:  [id number] DOB:  [birth year] Patient phone:  [phone] (home)  Patient address:   [address] [place] [address],  Total Time spent with patient: 30 minutes  Date of Admission:  11/14/2016 Date of Discharge: 11/17/2016  Reason for Admission: Per HPI- This is an admission assessment for this 36 year old Caucasian male with hx of alcoholism. He is admitted to the [facility] adult unit from the [facility] with complaints of alcohol intoxication & suicidal threats. He apparent had received alcohol detoxification treatment from the [facility] in February of 2018. After discharge, received substance abuse treatment at the [facility]. He spent 30 days. He was discharged to continue substance abuse treatment & counseling sessions on an outpatient basis at the [facility]. During this assessment, "[name] reports, "The cops took me to the ED 2 days ago. My wife called them. She told the cops that I was threatening to kill myself. That was not true. I went to the beach last weekend with the boys. I drank some alcohol with the boys. My wife found out & was upset about it. As I was lying down on the couch resting on Tuesday, my wife called the cops, they came & took me to the hospital because my wife lied that I was threatening suicide. I'm not suicidal, homicidal, hearing voices or seeing things. I was not depressed & I'm still not depressed. I do not need to be here. I'm anxious about my job, I will likely lose my job as I have not been there in 2 days. I'm determine to get back with my sponsor to resume my weekly AA meetings. I need to be discharge.".  Principal Problem: Substance induced mood disorder [facility]) Discharge Diagnoses: Patient Active Problem List   Diagnosis Date Noted  . Major depression, recurrent (HCC) [F33.9] 11/14/2016  . Alcohol use disorder, severe, dependence  (HCC) [F10.20] 11/14/2016  . Substance induced mood disorder (HCC) [F19.94] 11/14/2016  . Anxiety [F41.9] 10/11/2016  . Depression [F32.9] 10/11/2016  . Tobacco abuse [Z72.0] 10/11/2016  . 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 Insomnia [G47.00] 10/11/2016    Past Psychiatric History:   Past Medical History:  Past Medical History:  Diagnosis Date  . Allergy   . Anxiety   . Depression   . Insomnia   . Seizures (HCC)    17 years ago, was on a dilantin for 1 year  . Substance abuse     Past Surgical History:  Procedure Laterality Date  . WISDOM TOOTH EXTRACTION  2005   Family History:  Family History  Problem Relation Age of Onset  . Stroke Mother   . Drug abuse Mother   . Cirrhosis Father   . Alcohol abuse Sister   . Suicidality Maternal Grandfather    Family Psychiatric  History:  Social History:  History  Alcohol Use  . Yes    Comment: Last drink 08/03/2016     History  Drug Use  . Types: Marijuana, Cocaine    Comment: Last use 08/03/2016    Social History   Social History  . Marital status: Married    Spouse name: N/A  . Number of children: N/A  . Years of education: N/A   Social History Main Topics  . Smoking status: Current Every Day Smoker    Packs/day: 0.50    Types: Cigarettes  . Smokeless  tobacco: Never Used  . Alcohol use Yes     Comment: Last drink 08/03/2016  . Drug use: Yes    Types: Marijuana, Cocaine     Comment: Last use 08/03/2016  . Sexual activity: Yes   Other Topics Concern  . None   Social History Narrative   Work at [employer]   [place]   Married, 2 children       Hospital Course:  Reginald GamblesJohn R Bentley was admitted for Substance induced mood disorder [facility](HCC) , with psychosis and crisis management.  Pt was treated discharged with the medications listed below under Medication List.  Medical problems were identified and treated as needed.  Home medications were restarted as appropriate.  Improvement was monitored  by observation and Reginald GamblesJohn R Bentley 's daily report of symptom reduction.  Emotional and mental status was monitored by daily self-inventory reports completed by Reginald GamblesJohn R Bentley and clinical staff.         Reginald GamblesJohn R Bentley was evaluated by the treatment team for stability and plans for continued recovery upon discharge. Reginald GamblesJohn R Bentley 's motivation was an integral factor for scheduling further treatment. Employment, transportation, bed availability, health status, family support, and any pending legal issues were also considered during hospital stay. Pt was offered further treatment options upon discharge including but not limited to Residential, Intensive Outpatient, and Outpatient treatment.  Reginald GamblesJohn R Bentley will follow up with the services as listed below under Follow Up Information.     Upon completion of this admission the patient was both mentally and medically stable for discharge denying suicidal/homicidal ideation, auditory/visual/tactile hallucinations, delusional thoughts and paranoia.   Reginald GamblesJohn R Bentley responded well to treatment with Trazodone and Effexor 150 mg without adverse effects.Pt demonstrated improvement without reported or observed adverse effects to the point of stability appropriate for outpatient management. Pertinent labs include:BMP for which outpatient follow-up is necessary for lab recheck as mentioned below. Reviewed CBC, CMP, BAL175, and UDS; all unremarkable aside from noted exceptions.   Physical Findings: AIMS: Facial and Oral Movements Muscles of Facial Expression: None, normal Lips and Perioral Area: None, normal Jaw: None, normal Tongue: None, normal,Extremity Movements Upper (arms, wrists, hands, fingers): None, normal Lower (legs, knees, ankles, toes): None, normal, Trunk Movements Neck, shoulders, hips: None, normal, Overall Severity Severity of abnormal movements (highest score from questions above): None, normal Incapacitation due to abnormal movements:  None, normal Patient's awareness of abnormal movements (rate only patient's report): No Awareness, Dental Status Current problems with teeth and/or dentures?: No Does patient usually wear dentures?: No  CIWA:  CIWA-Ar Total: 2 COWS:     Musculoskeletal: Strength & Muscle Tone: within normal limits Gait & Station: normal Patient leans: N/A  Psychiatric Specialty Exam: SEE SRA by MD Physical Exam  ROS  Blood pressure 115/78, pulse 71, temperature 97.8 F (36.6 C), temperature source Oral, resp. rate 16, height 6\' 3"  (1.905 m), weight 84.4 kg (186 lb), SpO2 98 %.Body mass index is 23.25 kg/m.    Have you used any form of tobacco in the last 30 days? (Cigarettes, Smokeless Tobacco, Cigars, and/or Pipes): Yes  Has this patient used any form of tobacco in the last 30 days? (Cigarettes, Smokeless Tobacco, Cigars, and/or Pipes) Yes, Yes, A prescription for an FDA-approved tobacco cessation medication was offered at discharge and the patient refused  Blood Alcohol level:  Lab Results  Component Value Date   ETH 175 (H) 11/14/2016    Metabolic Disorder Labs:  No results  found for: HGBA1C, MPG No results found for: PROLACTIN No results found for: CHOL, TRIG, HDL, CHOLHDL, VLDL, LDLCALC  See Psychiatric Specialty Exam and Suicide Risk Assessment completed by Attending Physician prior to discharge.  Discharge destination:  Home  Is patient on multiple antipsychotic therapies at discharge:  No   Has Patient had three or more failed trials of antipsychotic monotherapy by history:  No  Recommended Plan for Multiple Antipsychotic Therapies: NA  Discharge Instructions    Diet - low sodium heart healthy    Complete by:  As directed    Discharge instructions    Complete by:  As directed    Take all medications as prescribed. Keep all follow-up appointments as scheduled.  Do not consume alcohol or use illegal drugs while on prescription medications. Report any adverse effects from your  medications to your primary care provider promptly.  In the event of recurrent symptoms or worsening symptoms, call 911, a crisis hotline, or go to the nearest emergency department for evaluation.   Increase activity slowly    Complete by:  As directed      Allergies as of 11/17/2016      Reactions   Banana Shortness Of Breath      Medication List    TAKE these medications     Indication  nicotine 21 mg/24hr patch Commonly known as:  NICODERM CQ - dosed in mg/24 hours Place 1 patch (21 mg total) onto the skin daily. Start taking on:  11/18/2016  Indication:  Nicotine Addiction   TRAVATAN Z 0.004 % Soln ophthalmic solution Generic drug:  Travoprost (BAK Free) INT 1 GTT IN OU QHS  Indication:  Wide-Angle Glaucoma   traZODone 50 MG tablet Commonly known as:  DESYREL Take 1 tablet (50 mg total) by mouth at bedtime as needed for sleep. What changed:  medication strength  how much to take  when to take this  reasons to take this  Indication:  Trouble Sleeping   venlafaxine XR 150 MG 24 hr capsule Commonly known as:  EFFEXOR-XR Take 1 capsule (150 mg total) by mouth daily. Start taking on:  11/18/2016 What changed:  when to take this  Indication:  Major Depressive Disorder      Follow-up Information    [facility] Follow up on 11/20/2016.   Why:  at 8:00am for therapy with Victorino Dike.  Contact information: [address] [place] [address] [phone]        [facility] Follow up on 11/23/2016.   Specialty:  Family Medicine Why:  at 11:00am for medication management with Woodward Ku.  Contact information: [address] [place] [address] [phone]          Follow-up recommendations:  Activity:  as tolerated Diet:  heart healthy  Comments:  Take all medications as prescribed. Keep all follow-up appointments as scheduled.  Do not consume alcohol or use  illegal drugs while on prescription medications. Report any adverse effects from your medications to your primary care provider promptly.  In the event of recurrent symptoms or worsening symptoms, call 911, a crisis hotline, or go to the nearest emergency department for evaluation.   Signed: Oneta Rack, NP 11/17/2016, 10:12 AM

## 2016-11-17 NOTE — [facility] Suicide Risk Assessment (Addendum)
[facility]   Principal Problem: Substance induced mood disorder [facility](HCC) Discharge Diagnoses:  Patient Active Problem List   Diagnosis Date Noted  . Major depression, recurrent (HCC) [F33.9] 11/14/2016  . Alcohol use disorder, severe, dependence (HCC) [F10.20] 11/14/2016  . Substance induced mood disorder (HCC) [F19.94] 11/14/2016  . Anxiety [F41.9] 10/11/2016  . Depression [F32.9] 10/11/2016  . Tobacco abuse [Z72.0] 10/11/2016  . 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 Insomnia [G47.00] 10/11/2016    Total Time spent with patient: 30 minutes  Musculoskeletal: Strength & Muscle Tone: within normal limits Gait & Station: normal Patient leans: N/A  Psychiatric Specialty Exam: ROS no chest pain, no shortness of breath, no nausea or vomiting   Blood pressure 115/78, pulse 71, temperature 97.8 F (36.6 C), temperature source Oral, resp. rate 16, height 6\' 3"  (1.905 m), weight 84.4 kg (186 lb), SpO2 98 %.Body mass index is 23.25 kg/m.  General Appearance: Well Groomed  Eye Contact::  Good  Speech:  Normal Rate409  Volume:  Normal  Mood:  improved mood, denies feeling depressed,states mood is normal  Affect:  Appropriate and reactive   Thought Process:  Linear and Descriptions of Associations: Intact  Orientation:  Full (Time, Place, and Person)  Thought Content:  denies hallucinations, no delusions, not internally preoccupied   Suicidal Thoughts:  No denies any suicidal or self injurious ideations, denies any homicidal or violent ideations towards anyone- specifically also denies any thoughts of violence towards his wife.   Homicidal Thoughts:  No  Memory:  recent and remote grossly intact   Judgement:  Other:  improving   Insight:  improving   Psychomotor Activity:  Normal- no symptoms of withdrawal- no tremors, no diaphoresis, no acute distress   Concentration:  Good  Recall:  Good  Fund of Knowledge:Good  Language: Good  Akathisia:   Negative  Handed:  Right  AIMS (if indicated):     Assets:  Desire for Improvement Resilience  Sleep:  Number of Hours: 4.5  Cognition: WNL  ADL's:  Intact   Mental Status Per Nursing Assessment::   On Admission:  NA  Demographic Factors:  36 year old married male, employed   Loss Factors: Recent relapse   Historical Factors: History of alcohol dependence   Risk Reduction Factors:   Responsible for children under 38 years of age, Sense of responsibility to family, Employed, Living with another person, especially a relative and Positive coping skills or problem solving skills  Continued Clinical Symptoms:  Patient presents alert, attentive, calm, reports mood as within normal, denies any depression at this time, affect appropriate, no thought disorder, no suicidal or self injurious ideations, no homicidal or violent ideations, future oriented . Denies medication side effects. Denies cravings for alcohol Is future oriented,and focused on returning to work.  With patient's express consent and in his presence, I have spoken with his wife via phone - she corroborates patient is improved, and is aware of discharge today .   Cognitive Features That Contribute To Risk:  No gross cognitive deficits noted upon discharge. Is alert , attentive, and oriented x 3   Suicide Risk:  Mild:  Suicidal ideation of limited frequency, intensity, duration, and specificity.  There are no identifiable plans, no associated intent, mild dysphoria and related symptoms, good self-control (both objective and subjective assessment), few other risk factors, and identifiable protective factors, including available and accessible social support.  Follow-up Information    [facility] Follow  up on 11/20/2016.   Why:  at 8:00am for therapy with Victorino Dike.  Contact information: [address] [place] [address] [phone]        [facility] Follow up on 11/23/2016.   Specialty:  Family Medicine Why:  at 11:00am for medication management with Woodward Ku.  Contact information: [address] [place] [address] [phone]          Plan Of Care/Follow-up recommendations:  Activity:  as tolerated  Diet:  regular Tests:  NA Other:  See below Patient is requesting discharge and there are no current grounds for involuntary commitment at this time Patient is leaving unit in good spirits  States he is committed to sobriety and that he plans to attend AA meetings regularly Follow up as above Craige Cotta, MD 11/17/2016, 11:29 AM

## 2016-11-17 NOTE — BHH Group Notes (Signed)
[facility] LCSW Group Therapy Note  11/17/2016 at 10:15 AM  Type of Therapy and Topic:  Group Therapy: Avoiding Self-Sabotaging and Enabling Behaviors  Participation Level:  Active  Participation Quality:  Appropriate  Affect:  Appropriate  Cognitive:  Appropriate  Insight:  Engaged  Engagement in Therapy:  Engaged   Therapeutic models used Cognitive Behavioral Therapy Person-Centered Therapy Motivational Interviewing   Summary of Patient Progress: The main focus of today's process group was to explain to the adolescent what "self-sabotage" means and use Motivational Interviewing to discuss what benefits, negative or positive, were involved in a self-identified self-sabotaging behavior. Patient identified with self medication in order to feel different.  We then talked about reasons the patient may want to change the behavior and their current desire to change. Patient identified changing hisd social behaviors upon discharge as he has been avoiding his sponsor and home group and intends to go there upon discharge.   Carney Bernatherine C Dierra Riesgo, LCSW

## 2016-11-17 NOTE — Progress Notes (Signed)
Nursing Discharge Note 11/17/2016 [id number]  Data Reports sleeping well, did not complete self-inventory sheet. Affect wide-ranged and appropriate mood euthymic.  Denies HI, SI, AVH.  Attending groups, appropriate and polite with staff and peers.  Received discharge orders.  Action Spoke with patient 1:1, nurse offered support to patient throughout shift.  Reviewed medications, discharge instructions, and follow up appointments with patient. Medication scripts reviewed and given to patient.  Paperwork, AVS, SRA, and transition record handed to patient.   Escorted off of unit at 1145. Belongings returned per belongings form.  Discharged to lobby where he was met by his ride.Marland Kitchen    Response Verbalized understanding of discharge teaching. Agrees to contact someone or 911 with thoughts/intent to harm self or others.    To follow up per AVS.

## 2016-11-23 ENCOUNTER — Ambulatory Visit: Payer: 59 | Admitting: Physician Assistant

## 2016-12-05 ENCOUNTER — Ambulatory Visit (INDEPENDENT_AMBULATORY_CARE_PROVIDER_SITE_OTHER): Payer: 59 | Admitting: Physician Assistant

## 2016-12-05 ENCOUNTER — Encounter: Payer: Self-pay | Admitting: Physician Assistant

## 2016-12-05 VITALS — BP 138/90 | HR 77 | Temp 98.1°F | Ht 75.0 in | Wt 188.0 lb

## 2016-12-05 DIAGNOSIS — F329 Major depressive disorder, single episode, unspecified: Secondary | ICD-10-CM

## 2016-12-05 DIAGNOSIS — Z9289 Personal history of other medical treatment: Secondary | ICD-10-CM | POA: Diagnosis not present

## 2016-12-05 DIAGNOSIS — F419 Anxiety disorder, unspecified: Secondary | ICD-10-CM | POA: Diagnosis not present

## 2016-12-05 DIAGNOSIS — F32A Depression, unspecified: Secondary | ICD-10-CM

## 2016-12-05 NOTE — Patient Instructions (Addendum)
It was so great to see you!  Please find a therapist. Follow-up with us in 3 months.  If you are feeling suicidal, tell someone! And please call: [phone]

## 2016-12-05 NOTE — Progress Notes (Signed)
Reginald Bentley is a 36 y.o. male is here for hospital follow up visit from Admission on 5/16 for Alcoholic Intoxication.  SCRIBE STATEMENT  History of Present Illness:   Chief Complaint  Patient presents with  . Hospitalization Follow-up    Adm 5/16  . Anxiety    HPI   Patient was hospitalized on 11/13/2016 for alcohol intoxication and suicidal thoughts. He reportedly went to the beach with his friends and was drinking 5-10 ounces of whiskey per day while there. When he returned from his trip his wife had him involuntarily committed as he was having suicidal thoughts.  His drug screen was negative for any illicit substances. He reports that he stayed at [facility] for 4-5 days, and was discharged on the same medications that he was initially on. He remains on Effexor and Trazodone. Since being discharged he has worked almost every day, he has a very stressful job. He reports that he works 8:30 AM to 11 PM daily, and his main source of income in his household. He is no longer seeing the therapist that he was seeing prior to his hospital admission, he reports to me that he no longer wants to see her because she is telling him to get a divorce from his wife. He is interested in finding a new therapist. He denies any current suicidal thoughts and reports that he never had a plan. He reports that he is still drinking beer however he does not drink liquor because that is when his emotions and decisions get out of control. He is interested in being evaluated for ADHD and reports that he has been on Adderall in the past. He reports that he feels as though the Effexor continues to work well for him, and it keeps him very even.   Depression screen [facility] 2/9 12/05/2016 10/11/2016  Decreased Interest 1 0  Down, Depressed, Hopeless 0 0  PHQ - 2 Score 1 0  Altered sleeping 1 0  Tired, decreased energy 2 1  Change in appetite 0 0  Feeling bad or failure about yourself  0 1  Trouble concentrating 2  2  Moving slowly or fidgety/restless 0 0  Suicidal thoughts 0 0  PHQ-9 Score 6 4   GAD 7 : Generalized Anxiety Score 12/05/2016  Nervous, Anxious, on Edge 1  Control/stop worrying 1  Worry too much - different things 1  Trouble relaxing 1  Restless 0  Easily annoyed or irritable 1  Afraid - awful might happen 0  Total GAD 7 Score 5  Anxiety Difficulty Somewhat difficult      There are no preventive care reminders to display for this patient.  PMHx, SurgHx, SocialHx, FamHx, Medications, and Allergies were reviewed in the Visit Navigator and updated as appropriate.   Patient Active Problem List   Diagnosis Date Noted  . Major depression, recurrent (HCC) 11/14/2016  . Alcohol use disorder, severe, dependence (HCC) 11/14/2016  . Substance induced mood disorder (HCC) 11/14/2016  . Anxiety 10/11/2016  . Depression 10/11/2016  . Tobacco abuse 10/11/2016  . Drug abuse in remission 10/11/2016  . Insomnia 10/11/2016    Social History  Substance Use Topics  . Smoking status: Current Every Day Smoker    Packs/day: 0.50    Types: Cigarettes  . Smokeless tobacco: Never Used  . Alcohol use Yes     Comment: Last drink 08/03/2016    Current Medications and Allergies:    Current Outpatient Prescriptions:  .  Travoprost, BAK Free, (  TRAVATAN Z) 0.004 % SOLN ophthalmic solution, INT 1 GTT IN OU QHS, Disp: , Rfl:  .  traZODone (DESYREL) 50 MG tablet, Take 1 tablet (50 mg total) by mouth at bedtime as needed for sleep., Disp: 30 tablet, Rfl: 0 .  venlafaxine XR (EFFEXOR-XR) 150 MG 24 hr capsule, Take 1 capsule (150 mg total) by mouth daily., Disp: 30 capsule, Rfl: 0   Allergies  Allergen Reactions  . Banana Shortness Of Breath    Review of Systems   Review of Systems  Constitutional: Positive for malaise/fatigue.  Cardiovascular: Negative for chest pain and palpitations.  Neurological: Negative for dizziness and headaches.  Psychiatric/Behavioral: Positive for depression and  substance abuse. Negative for suicidal ideas. The patient is nervous/anxious.        Smoked Reginald Bentley few days ago    Vitals:   Vitals:   12/05/16 1015  BP: 138/90  Pulse: 77  Temp: 98.1 F (36.7 C)  TempSrc: Oral  SpO2: 96%  Weight: 188 lb (85.3 kg)  Height: 6\' 3"  (1.905 m)     Body mass index is 23.5 kg/m.   Physical Exam:    Physical Exam  Constitutional: He appears well-developed. He is cooperative.  Non-toxic appearance. He does not have a sickly appearance. He does not appear ill. No distress.  Cardiovascular: Normal rate, regular rhythm, S1 normal, S2 normal, normal heart sounds and normal pulses.   No LE edema  Pulmonary/Chest: Effort normal and breath sounds normal.  Neurological: He is alert.  Nursing note and vitals reviewed.      Assessment and Plan:    Reginald Bentley was seen today for hospitalization follow-up and anxiety.  Diagnoses and all orders for this visit:  Hospitalization within last 30 days  Anxiety  Depression, unspecified depression type   Overall patient reports that he is stable since hospitalization. He remains on Effexor and trazodone. I have given him a list of therapists to contact so he can find a new therapist. I have also given him a list of psychiatrists that also manage ADHD, as he is interested in being evaluated for this. I provided patient with suicidal hotline number and advised him to go to the hospital if he develops any suicidal thoughts. I recommended that he avoid alcohol. He will follow-up with us in July for his physical.   . Reviewed expectations re: course of current medical issues. . Discussed self-management of symptoms. . Outlined signs and symptoms indicating need for more acute intervention. . Patient verbalized understanding and all questions were answered. . See orders for this visit as documented in the electronic medical record. . Patient received an After Visit Summary.  CMA or LPN served as scribe during this  visit. History, Physical, and Plan performed by medical provider. Documentation and orders reviewed and attested to.  Jarold MottoSamantha Psalm Schappell, PA-C [facility] 12/05/2016  Follow-up: Return in about 3 months (around 03/07/2017) for anxiety.

## 2016-12-31 ENCOUNTER — Telehealth: Payer: Self-pay | Admitting: Physician Assistant

## 2016-12-31 MED ORDER — VENLAFAXINE HCL ER 150 MG PO CP24: 150.0000 mg | ORAL_CAPSULE | Freq: Every day | ORAL | 0 refills | Status: DC

## 2016-12-31 NOTE — Telephone Encounter (Signed)
**  Remind patient they can make refill requests via MyChart**  Medication refill request (Name & Dosage):  venlafaxine XR (EFFEXOR-XR) 150 MG 24 hr capsule  Preferred pharmacy (Name & Address):  [facility] [id number] - [place], [place] - [address] AT [facility] [address] [phone] (Phone) [phone] (Fax)    Other comments (if applicable):  Notify patient once the rx has been placed via phone call. Patient has 1 pill left.

## 2016-12-31 NOTE — Telephone Encounter (Signed)
Pt requesting refill for Venalafaxine XR 150 mg, please advise if refill okay?

## 2016-12-31 NOTE — Telephone Encounter (Signed)
Discussed pt refill with Lelon MastSamantha, okay to refill Venlafaxine for one month supply. Rx sent to pharmacy.

## 2016-12-31 NOTE — Telephone Encounter (Signed)
Pt notified Rx refill sent to pharmacy. 

## 2017-01-07 NOTE — Progress Notes (Deleted)
I acted as a Neurosurgeonscribe for [employer]antha Worley, PA-C Reginald Mullonna Gwen Edler, LPN  Subjective:    Reginald GamblesJohn R Bentley is a 36 y.o. male and is here for a comprehensive physical exam.  HPI  There are no preventive care reminders to display for this patient.  Acute Concerns:   Chronic Issues:   Health Maintenance: Immunizations -- *** Colonoscopy -- *** PAP -- *** PSA -- *** Diet -- *** Caffeine intake -- *** Sleep habits -- *** Exercise -- *** Weight --    Mood -- ***  Depression screen PHQ 2/9 12/05/2016  Decreased Interest 1  Down, Depressed, Hopeless 0  PHQ - 2 Score 1  Altered sleeping 1  Tired, decreased energy 2  Change in appetite 0  Feeling bad or failure about yourself  0  Trouble concentrating 2  Moving slowly or fidgety/restless 0  Suicidal thoughts 0  PHQ-9 Score 6     Other providers/specialists:  PMHx, SurgHx, SocialHx, Medications, and Allergies were reviewed in the Visit Navigator and updated as appropriate.   Past Medical History:  Diagnosis Date  . Allergy   . Anxiety   . Depression   . Insomnia   . Seizures (HCC)    17 years ago, was on a dilantin for 1 year  . Substance abuse     Past Surgical History:  Procedure Laterality Date  . WISDOM TOOTH EXTRACTION  2005    Family History  Problem Relation Age of Onset  . Stroke Mother   . Drug abuse Mother   . Cirrhosis Father   . Alcohol abuse Sister   . Suicidality Maternal Grandfather     Social History  Substance Use Topics  . Smoking status: Current Every Day Smoker    Packs/day: 0.50    Types: Cigarettes  . Smokeless tobacco: Never Used  . Alcohol use Yes     Comment: Last drink 08/03/2016    Review of Systems:   ROS  Objective:   There were no vitals filed for this visit. There is no height or weight on file to calculate BMI.  General Appearance:  Alert, cooperative, no distress, appears stated age  Head:  Normocephalic, without obvious abnormality, atraumatic  Eyes:  PERRL,  conjunctiva/corneas clear, EOM's intact, fundi benign, both eyes       Ears:  Normal TM's and external ear canals, both ears  Nose: Nares normal, septum midline, mucosa normal, no drainage    or sinus tenderness  Throat: Lips, mucosa, and tongue normal; teeth and gums normal  Neck: Supple, symmetrical, trachea midline, no adenopathy; thyroid:  No enlargement/tenderness/nodules; no carotit bruit or JVD  Back:   Symmetric, no curvature, ROM normal, no CVA tenderness  Lungs:   Clear to auscultation bilaterally, respirations unlabored  Chest wall:  No tenderness or deformity  Heart:  Regular rate and rhythm, S1 and S2 normal, no murmur, rub   or gallop  Abdomen:   Soft, non-tender, bowel sounds active all four quadrants, no masses, no organomegaly  Extremities: Extremities normal, atraumatic, no cyanosis or edema  Prostate: Not done.   Skin: Skin color, texture, turgor normal, no rashes or lesions  Lymph nodes: Cervical, supraclavicular, and axillary nodes normal  Neurologic: CNII-XII grossly intact. Normal strength, sensation and reflexes throughout    Assessment/Plan:   There are no diagnoses linked to this encounter.  Well Adult Exam: Labs ordered: {Yes/No:18319::"Yes"}. Patient counseling was done. See below for items discussed. Discussed the patient's BMI.  The BMI {BMI plan (MU NQF measure  421):19504} Follow up {follow-up interval:13814}.  Patient Counseling: [x]   Nutrition: Stressed importance of moderation in sodium/caffeine intake, saturated fat and cholesterol, caloric balance, sufficient intake of fresh fruits, vegetables, and fiber.  [x]   Stressed the importance of regular exercise.   []   Substance Abuse: Discussed cessation/primary prevention of tobacco, alcohol, or other drug use; driving or other dangerous activities under the influence; availability of treatment for abuse.   [x]   Injury prevention: Discussed safety belts, safety helmets, smoke detector, smoking near bedding or  upholstery.   []   Sexuality: Discussed sexually transmitted diseases, partner selection, use of condoms, avoidance of unintended pregnancy  and contraceptive alternatives.   [x]   Dental health: Discussed importance of regular tooth brushing, flossing, and dental visits.  [x]   Health maintenance and immunizations reviewed. Please refer to Health maintenance section.     CMA or LPN served as scribe during this visit. History, Physical, and Plan performed by medical provider. Documentation and orders reviewed and attested to.  Jarold Motto, PA-C [facility]

## 2017-01-08 ENCOUNTER — Encounter: Payer: Self-pay | Admitting: Physician Assistant

## 2017-01-08 DIAGNOSIS — Z0289 Encounter for other administrative examinations: Secondary | ICD-10-CM

## 2017-01-09 ENCOUNTER — Encounter: Payer: PRIVATE HEALTH INSURANCE | Admitting: Physician Assistant

## 2017-01-30 ENCOUNTER — Telehealth: Payer: Self-pay | Admitting: Physician Assistant

## 2017-01-30 NOTE — Telephone Encounter (Signed)
Patient is requesting a refill of venlafaxine XR (EFFEXOR-XR) 150 MG 24 hr capsule [[id number]][[id number]]   sent to [facility] on file.

## 2017-01-31 MED ORDER — VENLAFAXINE HCL ER 150 MG PO CP24: 150.0000 mg | ORAL_CAPSULE | Freq: Every day | ORAL | 0 refills | Status: DC

## 2017-01-31 NOTE — Telephone Encounter (Signed)
Rx sent to pharmacy   

## 2017-02-05 ENCOUNTER — Ambulatory Visit (INDEPENDENT_AMBULATORY_CARE_PROVIDER_SITE_OTHER): Payer: 59 | Admitting: Physician Assistant

## 2017-02-05 DIAGNOSIS — Z5329 Procedure and treatment not carried out because of patient's decision for other reasons: Secondary | ICD-10-CM

## 2017-02-05 NOTE — Progress Notes (Deleted)
I acted as a Neurosurgeon for [employer], PA-C Corky Mull, LPN  Subjective:    Reginald Bentley is a 36 y.o. male and is here for a comprehensive physical exam.  HPI  Health Maintenance Due  Topic Date Due  . HIV Screening  01/04/1994  . INFLUENZA VACCINE  01/30/2017    Acute Concerns:   Chronic Issues:   Health Maintenance: Immunizations -- *** Colonoscopy -- *** PAP -- *** PSA -- *** Diet -- *** Caffeine intake -- *** Sleep habits -- *** Exercise -- *** Weight --    Mood -- ***  Depression screen PHQ 2/9 12/05/2016  Decreased Interest 1  Down, Depressed, Hopeless 0  PHQ - 2 Score 1  Altered sleeping 1  Tired, decreased energy 2  Change in appetite 0  Feeling bad or failure about yourself  0  Trouble concentrating 2  Moving slowly or fidgety/restless 0  Suicidal thoughts 0  PHQ-9 Score 6     Other providers/specialists:  PMHx, SurgHx, SocialHx, Medications, and Allergies were reviewed in the Visit Navigator and updated as appropriate.   Past Medical History:  Diagnosis Date  . Allergy   . Anxiety   . Depression   . Insomnia   . Seizures (HCC)    17 years ago, was on a dilantin for 1 year  . Substance abuse     Past Surgical History:  Procedure Laterality Date  . WISDOM TOOTH EXTRACTION  2005    Family History  Problem Relation Age of Onset  . Stroke Mother   . Drug abuse Mother   . Cirrhosis Father   . Alcohol abuse Sister   . Suicidality Maternal Grandfather     Social History  Substance Use Topics  . Smoking status: Current Every Day Smoker    Packs/day: 0.50    Types: Cigarettes  . Smokeless tobacco: Never Used  . Alcohol use Yes     Comment: Last drink 08/03/2016    Review of Systems:   ROS  Objective:   There were no vitals filed for this visit. There is no height or weight on file to calculate BMI.  General Appearance:  Alert, cooperative, no distress, appears stated age  Head:  Normocephalic, without obvious  abnormality, atraumatic  Eyes:  PERRL, conjunctiva/corneas clear, EOM's intact, fundi benign, both eyes       Ears:  Normal TM's and external ear canals, both ears  Nose: Nares normal, septum midline, mucosa normal, no drainage    or sinus tenderness  Throat: Lips, mucosa, and tongue normal; teeth and gums normal  Neck: Supple, symmetrical, trachea midline, no adenopathy; thyroid:  No enlargement/tenderness/nodules; no carotit bruit or JVD  Back:   Symmetric, no curvature, ROM normal, no CVA tenderness  Lungs:   Clear to auscultation bilaterally, respirations unlabored  Chest wall:  No tenderness or deformity  Heart:  Regular rate and rhythm, S1 and S2 normal, no murmur, rub   or gallop  Abdomen:   Soft, non-tender, bowel sounds active all four quadrants, no masses, no organomegaly  Extremities: Extremities normal, atraumatic, no cyanosis or edema  Prostate: Not done.   Skin: Skin color, texture, turgor normal, no rashes or lesions  Lymph nodes: Cervical, supraclavicular, and axillary nodes normal  Neurologic: CNII-XII grossly intact. Normal strength, sensation and reflexes throughout    Assessment/Plan:   There are no diagnoses linked to this encounter.  Well Adult Exam: Labs ordered: {Yes/No:18319::"Yes"}. Patient counseling was done. See below for items discussed. Discussed the  patient's BMI.  The BMI {BMI plan (MU NQF measure 421):19504} Follow up {follow-up interval:13814}.  Patient Counseling: [x]   Nutrition: Stressed importance of moderation in sodium/caffeine intake, saturated fat and cholesterol, caloric balance, sufficient intake of fresh fruits, vegetables, and fiber.  [x]   Stressed the importance of regular exercise.   []   Substance Abuse: Discussed cessation/primary prevention of tobacco, alcohol, or other drug use; driving or other dangerous activities under the influence; availability of treatment for abuse.   [x]   Injury prevention: Discussed safety belts, safety helmets,  smoke detector, smoking near bedding or upholstery.   []   Sexuality: Discussed sexually transmitted diseases, partner selection, use of condoms, avoidance of unintended pregnancy  and contraceptive alternatives.   [x]   Dental health: Discussed importance of regular tooth brushing, flossing, and dental visits.  [x]   Health maintenance and immunizations reviewed. Please refer to Health maintenance section.     CMA or LPN served as scribe during this visit. History, Physical, and Plan performed by medical provider. Documentation and orders reviewed and attested to.  Jarold MottoSamantha Worley, PA-C [place] [facility]

## 2017-02-06 ENCOUNTER — Telehealth: Payer: Self-pay | Admitting: Physician Assistant

## 2017-02-06 MED ORDER — VENLAFAXINE HCL ER 150 MG PO CP24: 150.0000 mg | ORAL_CAPSULE | Freq: Every day | ORAL | 0 refills | Status: DC

## 2017-02-06 NOTE — Telephone Encounter (Signed)
Error

## 2017-02-06 NOTE — Telephone Encounter (Signed)
Patient called in reference to needing medications refilled. [facility]

## 2017-02-06 NOTE — Telephone Encounter (Signed)
Patient called office asking for medication refills, he was transferred to my phone per my request.  Patient is tearful on phone. He reports that he was fired from his job, he currently is without his car, his marriage is over and he is currently in an extended stay [address] [place], in room [address]. He reports that he has been staying there since Monday. He denies any alcohol or drug use today. Does endorse drinking over the weekend.  When asked if he was suicidal, patient reports "no, I am not suicidal. My mom killed herself and I would never do that."   He reports that his wife flushed his Effexor XR down the toilet and he has been 1 day without it. He has never been without this medication and he is having significant side effects, including "brain zaps." He is asking for the medication to be refilled. I discussed with him that I am out of the office for the rest of the day, however we have other providers who would be happy to see him. He declined seeing other providers, but did schedule an appointment to see me at 10am tomorrow morning. I have refilled his Effexor x 1 month. I discussed with patient that if they develop any SI, to tell someone immediately and seek medical attention. Patient verbalized understanding.  Jarold MottoSamantha Aarav Burgett PA-C 02/06/17

## 2017-02-07 ENCOUNTER — Encounter: Payer: Self-pay | Admitting: Physician Assistant

## 2017-02-07 ENCOUNTER — Ambulatory Visit: Payer: Self-pay | Admitting: Physician Assistant

## 2017-02-07 DIAGNOSIS — Z0289 Encounter for other administrative examinations: Secondary | ICD-10-CM

## 2017-02-07 NOTE — Progress Notes (Deleted)
Reginald GamblesJohn R Bentley is a 36 y.o. male is here to discuss:  I acted as a Neurosurgeonscribe for [employer]antha Worley, PA-C Corky Mullonna Celester Morgan, LPN   History of Present Illness:   No chief complaint on file.   HPI  Health Maintenance Due  Topic Date Due  . HIV Screening  01/04/1994  . INFLUENZA VACCINE  01/30/2017    Past Medical History:  Diagnosis Date  . Allergy   . Anxiety   . Depression   . Insomnia   . Seizures (HCC)    17 years ago, was on a dilantin for 1 year  . Substance abuse      Social History   Social History  . Marital status: Married    Spouse name: N/A  . Number of children: N/A  . Years of education: N/A   Occupational History  . Not on file.   Social History Main Topics  . Smoking status: Current Every Day Smoker    Packs/day: 0.50    Types: Cigarettes  . Smokeless tobacco: Never Used  . Alcohol use Yes     Comment: Last drink 08/03/2016  . Drug use: Yes    Types: Marijuana, Cocaine     Comment: Last use 08/03/2016  . Sexual activity: Yes   Other Topics Concern  . Not on file   Social History Narrative   Work at [employer]   [place]   Married, 2 children       Past Surgical History:  Procedure Laterality Date  . WISDOM TOOTH EXTRACTION  2005    Family History  Problem Relation Age of Onset  . Stroke Mother   . Drug abuse Mother   . Cirrhosis Father   . Alcohol abuse Sister   . Suicidality Maternal Grandfather     PMHx, SurgHx, SocialHx, FamHx, Medications, and Allergies were reviewed in the Visit Navigator and updated as appropriate.   Patient Active Problem List   Diagnosis Date Noted  . Major depression, recurrent (HCC) 11/14/2016  . Alcohol use disorder, severe, dependence (HCC) 11/14/2016  . Substance induced mood disorder (HCC) 11/14/2016  . Anxiety 10/11/2016  . Depression 10/11/2016  . Tobacco abuse 10/11/2016  . Drug abuse in remission 10/11/2016  . Insomnia 10/11/2016    Social History  Substance Use Topics  .  Smoking status: Current Every Day Smoker    Packs/day: 0.50    Types: Cigarettes  . Smokeless tobacco: Never Used  . Alcohol use Yes     Comment: Last drink 08/03/2016    Current Medications and Allergies:    Current Outpatient Prescriptions:  .  Travoprost, BAK Free, (TRAVATAN Z) 0.004 % SOLN ophthalmic solution, INT 1 GTT IN OU QHS, Disp: , Rfl:  .  traZODone (DESYREL) 50 MG tablet, Take 1 tablet (50 mg total) by mouth at bedtime as needed for sleep., Disp: 30 tablet, Rfl: 0 .  venlafaxine XR (EFFEXOR-XR) 150 MG 24 hr capsule, Take 1 capsule (150 mg total) by mouth daily., Disp: 30 capsule, Rfl: 0  Allergies  Allergen Reactions  . Banana Shortness Of Breath    Review of Systems   Review of Systems    Vitals:  There were no vitals filed for this visit.   There is no height or weight on file to calculate BMI.   Physical Exam:    Physical Exam   Assessment and Plan:    There are no diagnoses linked to this encounter.  . Reviewed expectations re: course  of current medical issues. . Discussed self-management of symptoms. . Outlined signs and symptoms indicating need for more acute intervention. . Patient verbalized understanding and all questions were answered. . See orders for this visit as documented in the electronic medical record. . Patient received an After Visit Summary.  CMA or LPN served as scribe during this visit. History, Physical, and Plan performed by medical provider. Documentation and orders reviewed and attested to.  Jarold Motto, PA-C [facility] 02/07/2017  Follow-up: No Follow-up on file.

## 2017-02-07 NOTE — Progress Notes (Signed)
Patient did not show for appointment.  No charge. 

## 2017-02-20 ENCOUNTER — Ambulatory Visit (INDEPENDENT_AMBULATORY_CARE_PROVIDER_SITE_OTHER): Payer: 59 | Admitting: Family Medicine

## 2017-02-20 ENCOUNTER — Ambulatory Visit (INDEPENDENT_AMBULATORY_CARE_PROVIDER_SITE_OTHER): Payer: 59 | Admitting: Physician Assistant

## 2017-02-20 ENCOUNTER — Encounter: Payer: Self-pay | Admitting: Physician Assistant

## 2017-02-20 VITALS — BP 130/90 | HR 88 | Ht 75.0 in | Wt 191.8 lb

## 2017-02-20 DIAGNOSIS — M25512 Pain in left shoulder: Secondary | ICD-10-CM | POA: Diagnosis not present

## 2017-02-20 DIAGNOSIS — F1020 Alcohol dependence, uncomplicated: Secondary | ICD-10-CM | POA: Diagnosis not present

## 2017-02-20 DIAGNOSIS — F419 Anxiety disorder, unspecified: Secondary | ICD-10-CM

## 2017-02-20 DIAGNOSIS — G8929 Other chronic pain: Secondary | ICD-10-CM

## 2017-02-20 DIAGNOSIS — R569 Unspecified convulsions: Secondary | ICD-10-CM | POA: Diagnosis not present

## 2017-02-20 DIAGNOSIS — F329 Major depressive disorder, single episode, unspecified: Secondary | ICD-10-CM | POA: Diagnosis not present

## 2017-02-20 DIAGNOSIS — F32A Depression, unspecified: Secondary | ICD-10-CM

## 2017-02-20 LAB — CBC WITH DIFFERENTIAL/PLATELET
Basophils Absolute: 0.1 10*3/uL (ref 0.0–0.1)
Basophils Relative: 2.1 % (ref 0.0–3.0)
EOS PCT: 3.9 % (ref 0.0–5.0)
Eosinophils Absolute: 0.2 10*3/uL (ref 0.0–0.7)
HEMATOCRIT: 46.2 % (ref 39.0–52.0)
HEMOGLOBIN: 15.3 g/dL (ref 13.0–17.0)
LYMPHS PCT: 27.9 % (ref 12.0–46.0)
Lymphs Abs: 1.8 10*3/uL (ref 0.7–4.0)
MCHC: 33.2 g/dL (ref 30.0–36.0)
MCV: 90.6 fl (ref 78.0–100.0)
Monocytes Absolute: 0.5 10*3/uL (ref 0.1–1.0)
Monocytes Relative: 7.8 % (ref 3.0–12.0)
NEUTROS ABS: 3.7 10*3/uL (ref 1.4–7.7)
Neutrophils Relative %: 58.3 % (ref 43.0–77.0)
PLATELETS: 387 10*3/uL (ref 150.0–400.0)
RBC: 5.1 Mil/uL (ref 4.22–5.81)
RDW: 14.2 % (ref 11.5–15.5)
WBC: 6.3 10*3/uL (ref 4.0–10.5)

## 2017-02-20 LAB — COMPREHENSIVE METABOLIC PANEL
ALBUMIN: 4.8 g/dL (ref 3.5–5.2)
ALT: 23 U/L (ref 0–53)
AST: 32 U/L (ref 0–37)
Alkaline Phosphatase: 64 U/L (ref 39–117)
BUN: 13 mg/dL (ref 6–23)
CALCIUM: 9.9 mg/dL (ref 8.4–10.5)
CHLORIDE: 103 meq/L (ref 96–112)
CO2: 31 meq/L (ref 19–32)
Creatinine, Ser: 0.97 mg/dL (ref 0.40–1.50)
GFR: 92 mL/min (ref >60.00)
Glucose, Bld: 84 mg/dL (ref 70–99)
Potassium: 4.1 mEq/L (ref 3.5–5.1)
Sodium: 140 mEq/L (ref 135–145)
Total Bilirubin: 0.3 mg/dL (ref 0.2–1.2)
Total Protein: 7.4 g/dL (ref 6.0–8.3)

## 2017-02-20 LAB — PHOSPHORUS: Phosphorus: 3.1 mg/dL (ref 2.3–4.6)

## 2017-02-20 LAB — MAGNESIUM: MAGNESIUM: 2.2 mg/dL (ref 1.5–2.5)

## 2017-02-20 MED ORDER — VENLAFAXINE HCL ER 150 MG PO CP24: 150.0000 mg | ORAL_CAPSULE | Freq: Every day | ORAL | 2 refills | Status: DC

## 2017-02-20 NOTE — Progress Notes (Addendum)
Shoulder Injection Procedure Note  Pre-operative Diagnosis: left shoulder pain  Post-operative Diagnosis: same  Indications: Pain Relief   Anesthesia: Topical ethyl chloride  Procedure Details   Written consent was obtained for the procedure. The shoulder was prepped with iodine and the skin was anesthetized with topical ethyl choride. Using a 22 gauge needle the subacromial space was injected with 2 mL 1% lidocaine and 1 mL of 40cc/ml depomedrol under the posterior aspect of the acromion. The injection site was cleansed with topical isopropyl alcohol and a dressing was applied.  Complications:  None; patient tolerated the procedure well.  Katina Degree. Jimmey Ralph, MD 02/20/2017 10:31 AM

## 2017-02-20 NOTE — Patient Instructions (Addendum)
It was great to see you today.  I would like for you to follow up with a psychiatrist, and establish care within the next 3 months. I will put in a referral. I have refilled your Effexor for the next 3 months.  If you develop any suicidal or homicidal thoughts, please tell someone and go to the hospital.  If your shoulder pain continues to worsen -- please make an appointment with Dr. Gaspar Bidding.  If you change your mind about a neurology referral, I would be happy to send you.   Please resume seeing your therapist.
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
## 2017-04-17 ENCOUNTER — Ambulatory Visit (HOSPITAL_COMMUNITY): Payer: Self-pay | Admitting: Psychiatry

## 2017-06-04 ENCOUNTER — Encounter: Payer: Self-pay | Admitting: Family Medicine

## 2017-06-04 ENCOUNTER — Other Ambulatory Visit (HOSPITAL_COMMUNITY)
Admission: RE | Admit: 2017-06-04 | Discharge: 2017-06-04 | Disposition: A | Discharge status: Home / Self Care | Payer: 59 | Source: Ambulatory Visit | Attending: Family Medicine | Admitting: Family Medicine

## 2017-06-04 ENCOUNTER — Ambulatory Visit (INDEPENDENT_AMBULATORY_CARE_PROVIDER_SITE_OTHER): Payer: 59 | Admitting: Family Medicine

## 2017-06-04 VITALS — BP 138/94 | HR 84 | Ht 75.0 in | Wt 181.2 lb

## 2017-06-04 DIAGNOSIS — Z113 Encounter for screening for infections with a predominantly sexual mode of transmission: Secondary | ICD-10-CM | POA: Insufficient documentation

## 2017-06-04 DIAGNOSIS — F330 Major depressive disorder, recurrent, mild: Secondary | ICD-10-CM

## 2017-06-04 DIAGNOSIS — R739 Hyperglycemia, unspecified: Secondary | ICD-10-CM

## 2017-06-04 DIAGNOSIS — R030 Elevated blood-pressure reading, without diagnosis of hypertension: Secondary | ICD-10-CM | POA: Diagnosis not present

## 2017-06-04 DIAGNOSIS — F1910 Other psychoactive substance abuse, uncomplicated: Secondary | ICD-10-CM | POA: Diagnosis not present

## 2017-06-04 DIAGNOSIS — Z13220 Encounter for screening for lipoid disorders: Secondary | ICD-10-CM

## 2017-06-04 DIAGNOSIS — Z114 Encounter for screening for human immunodeficiency virus [HIV]: Secondary | ICD-10-CM | POA: Diagnosis not present

## 2017-06-04 DIAGNOSIS — Z1159 Encounter for screening for other viral diseases: Secondary | ICD-10-CM | POA: Diagnosis not present

## 2017-06-04 DIAGNOSIS — F419 Anxiety disorder, unspecified: Secondary | ICD-10-CM | POA: Diagnosis not present

## 2017-06-04 LAB — COMPREHENSIVE METABOLIC PANEL
ALK PHOS: 56 U/L (ref 39–117)
ALT: 19 U/L (ref 0–53)
AST: 25 U/L (ref 0–37)
Albumin: 4.8 g/dL (ref 3.5–5.2)
BILIRUBIN TOTAL: 0.5 mg/dL (ref 0.2–1.2)
BUN: 9 mg/dL (ref 6–23)
CALCIUM: 9.8 mg/dL (ref 8.4–10.5)
CO2: 26 mEq/L (ref 19–32)
CREATININE: 0.75 mg/dL (ref 0.40–1.50)
Chloride: 102 mEq/L (ref 96–112)
GFR: 123.61 mL/min (ref >60.00)
Glucose, Bld: 94 mg/dL (ref 70–99)
Potassium: 4.2 mEq/L (ref 3.5–5.1)
Sodium: 137 mEq/L (ref 135–145)
TOTAL PROTEIN: 7 g/dL (ref 6.0–8.3)

## 2017-06-04 LAB — CBC
HCT: 44.6 % (ref 39.0–52.0)
HEMOGLOBIN: 15.3 g/dL (ref 13.0–17.0)
MCHC: 34.3 g/dL (ref 30.0–36.0)
MCV: 88 fl (ref 78.0–100.0)
PLATELETS: 282 10*3/uL (ref 150.0–400.0)
RBC: 5.08 Mil/uL (ref 4.22–5.81)
RDW: 12.7 % (ref 11.5–15.5)
WBC: 6.1 10*3/uL (ref 4.0–10.5)

## 2017-06-04 LAB — LIPID PANEL
Cholesterol: 261 mg/dL — ABNORMAL HIGH (ref 0–200)
HDL: 58.7 mg/dL (ref >39.00)
LDL Cholesterol: 165 mg/dL — ABNORMAL HIGH (ref 0–99)
NonHDL: 201.91
TRIGLYCERIDES: 183 mg/dL — AB (ref 0.0–149.0)
Total CHOL/HDL Ratio: 4
VLDL: 36.6 mg/dL (ref 0.0–40.0)

## 2017-06-04 LAB — HEMOGLOBIN A1C: Hgb A1c MFr Bld: 5.2 % (ref 4.6–6.5)

## 2017-06-04 MED ORDER — VENLAFAXINE HCL ER 150 MG PO CP24: 150.0000 mg | ORAL_CAPSULE | Freq: Every day | ORAL | 2 refills | Status: DC

## 2017-06-04 NOTE — Progress Notes (Signed)
    Subjective:  Reginald Bentley is a 36 y.o. male who presents today with a chief complaint of substance abuse follow-up.   HPI:  Polysubstance abuse, established problem, stable Patient recently discharged from rehab approximately 3 weeks ago.  He was in inpatient rehab for about 14 days.  He has been using alcohol occasionally since being discharged, however denies use of any other substances.  He has been going to [employer] and NA meetings.  STD screening, acute issue Patient also wishes to be checked for STDs today.  Does not have any current symptoms, however would like to be screened.  Anxiety/depression, established problem, stable Would like a refill on his Effexor today.  Has been a little bit more irritated over the past few weeks, however feels like his mood is otherwise stable.  ROS: Per HPI  PMH: Smoking history reviewed.  Current smoker.   Objective:  Physical Exam: BP (!) 138/94 (BP Location: Left Arm, Cuff Size: Normal)   Pulse 84   Ht 6\' 3"  (1.905 m)   Wt 181 lb 3.2 oz (82.2 kg)   SpO2 99%   BMI 22.65 kg/m   Gen: NAD, resting comfortably CV: RRR with no murmurs appreciated Pulm: NWOB, CTAB with no crackles, wheezes, or rhonchi GI: Normal bowel sounds present. Soft, Nontender, Nondistended. MSK: No edema, cyanosis, or clubbing noted Skin: Warm, dry Neuro: Grossly normal, moves all extremities Psych: Normal affect and thought content  Assessment/Plan:  Polysubstance abuse (HCC) Encouraged continued cessation.  Discussed importance of support mechanisms in the community.  Also gave information about our behavioral specialist for further counseling and discussion.  Major depression, recurrent (HCC) Effexor refilled today.  Anxiety Effexor refilled today.  Elevated blood pressure reading Typically well controlled.  Advised patient continue checking at home with goal blood pressure 140/90 or less.  No medication start medications today.  Return precautions  reviewed.  Follow-up at next office visit.  STD screen Check urine gonorrhea/chlamydia/trichomonas.  Check HIV, RPR, hepatitis C antibodies.  Hyperglycemia Check A1c today.  Preventative healthcare Flu shot deferred.  Check lipid panel, CBC, CMET, and A1c.  Katina Degreealeb M. Jimmey RalphParker, MD 06/04/2017 12:34 PM

## 2017-06-04 NOTE — Assessment & Plan Note (Signed)
Effexor refilled today.

## 2017-06-04 NOTE — Patient Instructions (Signed)
We will check blood work today.  We will call you with the results in a few days.  Take care,  Dr Jimmey RalphParker

## 2017-06-04 NOTE — Assessment & Plan Note (Signed)
Encouraged continued cessation.  Discussed importance of support mechanisms in the community.  Also gave information about our behavioral specialist for further counseling and discussion.

## 2017-06-05 LAB — HEPATITIS C ANTIBODY
Hepatitis C Ab: NONREACTIVE
SIGNAL TO CUT-OFF: 0.02 (ref <1.00)

## 2017-06-05 LAB — HIV ANTIBODY (ROUTINE TESTING W REFLEX): HIV: NONREACTIVE

## 2017-06-05 LAB — RPR: RPR Ser Ql: NONREACTIVE

## 2017-06-05 LAB — URINE CYTOLOGY ANCILLARY ONLY
CHLAMYDIA, DNA PROBE: NEGATIVE
NEISSERIA GONORRHEA: NEGATIVE
Trichomonas: NEGATIVE

## 2017-06-06 NOTE — Progress Notes (Signed)
STD testing was normal. Cholesterol is high - would benefit from starting cholesterol medication. We should recheck in 6- 12 months.   Other labs are normal.  Sherrel Shafer M. Jimmey RalphParker, MD 06/06/2017 11:26 AM

## 2017-06-18 ENCOUNTER — Ambulatory Visit (HOSPITAL_COMMUNITY): Payer: Self-pay | Admitting: Psychiatry

## 2017-07-05 ENCOUNTER — Other Ambulatory Visit: Payer: Self-pay | Admitting: Physician Assistant

## 2017-07-05 NOTE — Telephone Encounter (Signed)
Dr. Jimmey RalphParker, pt requesting refill for Trazodone. Please advise.

## 2017-07-05 NOTE — Telephone Encounter (Signed)
Ok to refill. Please place any necessary orders.   

## 2017-11-10 ENCOUNTER — Other Ambulatory Visit: Payer: Self-pay | Admitting: Physician Assistant

## 2017-12-16 ENCOUNTER — Other Ambulatory Visit: Payer: Self-pay

## 2017-12-16 ENCOUNTER — Telehealth: Payer: Self-pay | Admitting: Physician Assistant

## 2017-12-16 DIAGNOSIS — F330 Major depressive disorder, recurrent, mild: Secondary | ICD-10-CM

## 2017-12-16 MED ORDER — VENLAFAXINE HCL ER 150 MG PO CP24
150.0000 mg | ORAL_CAPSULE | Freq: Every day | ORAL | 0 refills | Status: AC
Start: 2017-12-16 — End: ?

## 2017-12-16 NOTE — Telephone Encounter (Signed)
See note

## 2017-12-16 NOTE — Telephone Encounter (Signed)
Copied from CRM [id number]. Topic: Quick Communication - Rx Refill/Question >> Dec 16, 2017 10:48 AM Mcneil, Ja-Kwan wrote: Medication: venlafaxine XR (EFFEXOR-XR) 150 MG 24 hr capsule  Has the patient contacted their pharmacy? yes   Preferred Pharmacy (with phone number or street name): [facility] [id number] - [place], [place] - [address] [phone] (Phone) [phone] (Fax)   Agent: Please be advised that RX refills may take up to 3 business days. We ask that you follow-up with your pharmacy.

## 2017-12-16 NOTE — Telephone Encounter (Signed)
Pt states he is living in [place] and does not have a pcp. Would like another 30 days and he will find a dr, Pt unable to come for an appt

## 2017-12-16 NOTE — Telephone Encounter (Signed)
Pt requesting refill of Effexor-XR  LOV 06/04/17 Dr. Jimmey RalphPArker  [facility] 06/04/17  #30  0 refills  Pt states he has moved to [place]. and has not found a PCP as of yet. He is requesting a 30 day refill until he finds a provider. If appropriate:   [facility] [id number] - [place], [place] - [address]           [phone] (Phone) [phone] (Fax)

## 2017-12-16 NOTE — Telephone Encounter (Signed)
Left message for pt. - he needs to schedule an appointment for further refills. This message was sent last month.

## 2017-12-16 NOTE — Telephone Encounter (Signed)
Refill sent but no more will be given from this provider. Pt must schedule an appt for future refills.

## 2017-12-17 NOTE — Telephone Encounter (Signed)
Left message on voicemail Rx was sent to pharmacy. No further refills.

## 2018-06-04 IMAGING — CR DG HAND COMPLETE 3+V*R*
3 series · 3 of 3 positions shown · non-contrast
Comparison: None.

CLINICAL DATA: Injury 2 weeks ago. Continued pain at ring finger.
Reported dislocation.

EXAM:
RIGHT HAND - COMPLETE 3+ VIEW

[x hand pa right]
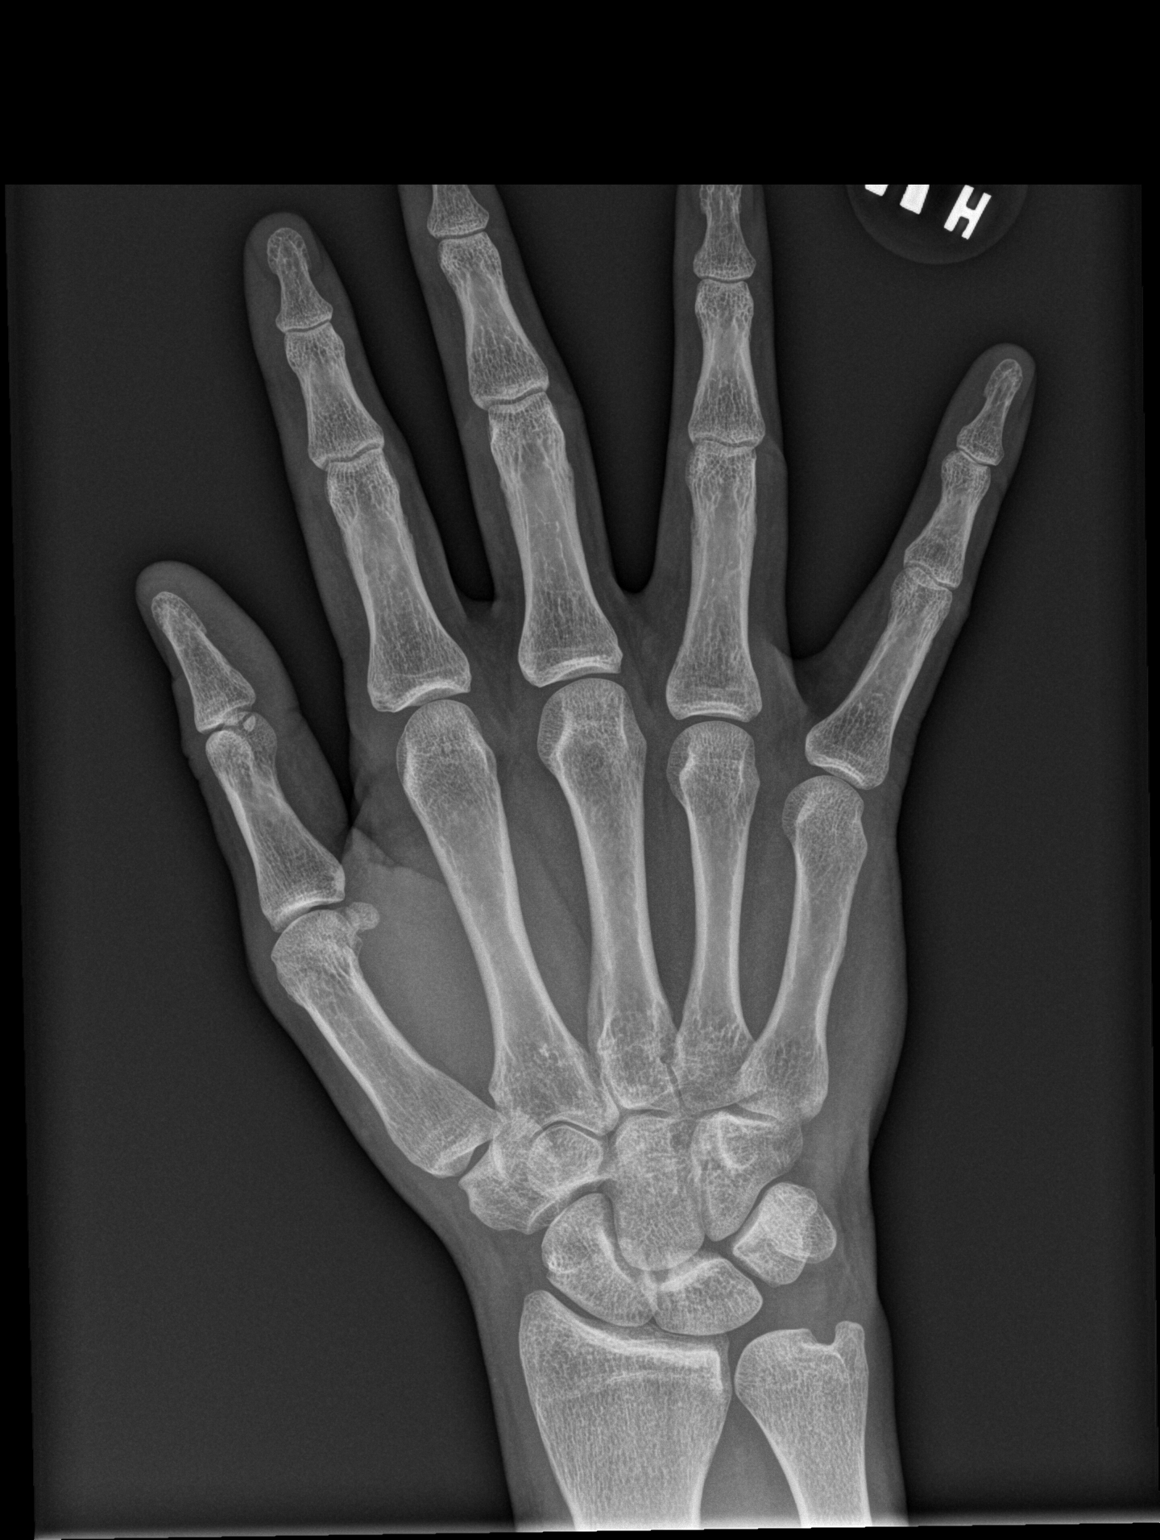

[x hand obl right]
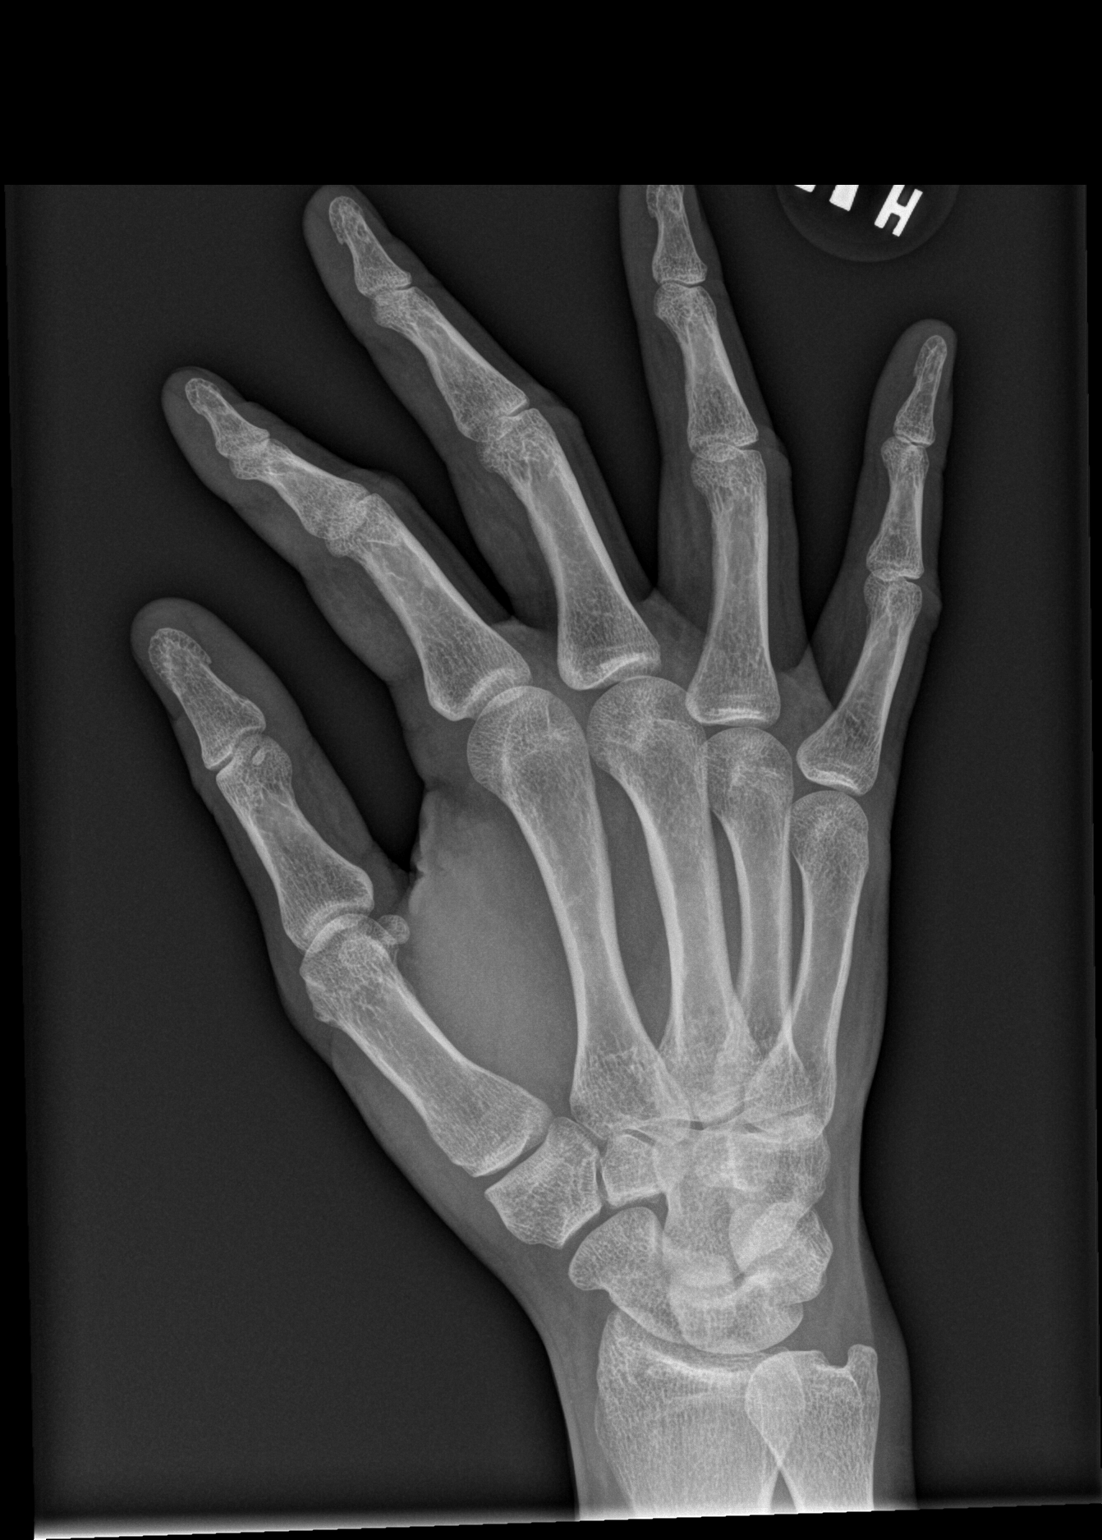

[x hand lat right]
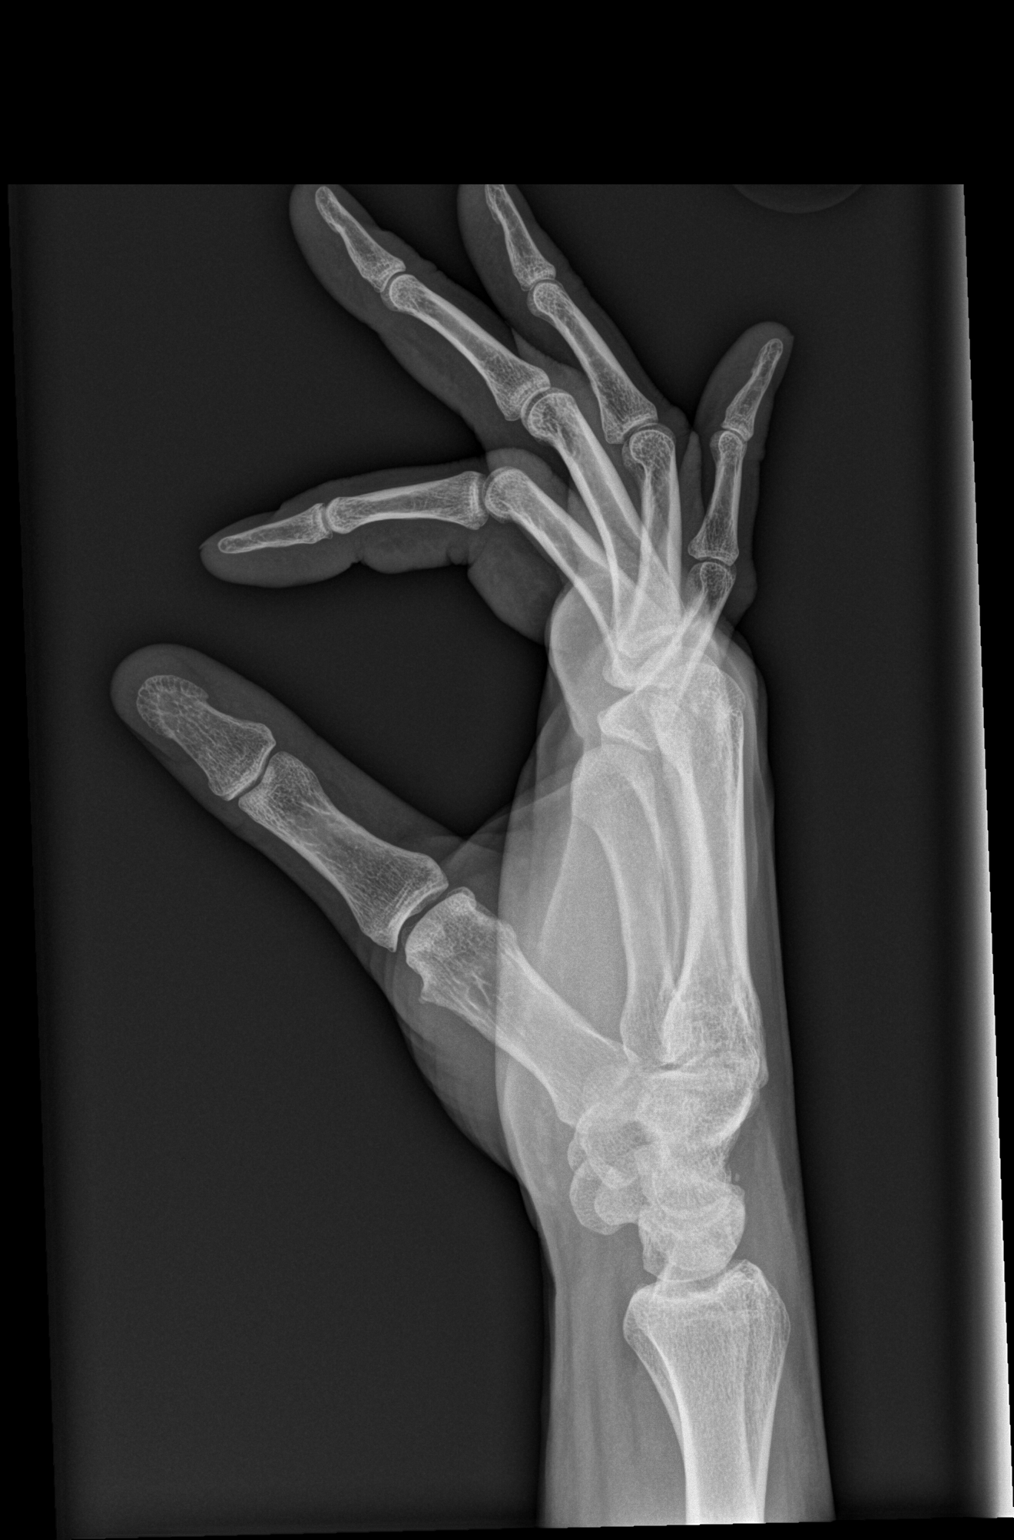

[3 of 3 positions shown; findings below may reference images not displayed]

FINDINGS: There is no evidence of fracture or dislocation. There is no
evidence of arthropathy or other focal bone abnormality. Soft
tissues are unremarkable.
IMPRESSION: Negative.
